# Patient Record
Sex: Female | Born: 1945 | Race: White | Hispanic: No | Smoking: Never smoker
Health system: Southern US, Community
[De-identification: ages and names within clinical notes are randomized; demographics above are authoritative.]

## PROBLEM LIST (undated history)

## (undated) DIAGNOSIS — F419 Anxiety disorder, unspecified: Secondary | ICD-10-CM

## (undated) DIAGNOSIS — Z87440 Personal history of urinary (tract) infections: Secondary | ICD-10-CM

## (undated) HISTORY — DX: Anxiety disorder, unspecified: F41.9

---

## 2009-01-16 ENCOUNTER — Ambulatory Visit (HOSPITAL_COMMUNITY): Admission: RE | Admit: 2009-01-16 | Discharge: 2009-01-16 | Payer: Self-pay | Admitting: Unknown Physician Specialty

## 2010-10-02 ENCOUNTER — Other Ambulatory Visit (HOSPITAL_COMMUNITY): Payer: Self-pay | Admitting: *Deleted

## 2010-10-02 DIAGNOSIS — Z1231 Encounter for screening mammogram for malignant neoplasm of breast: Secondary | ICD-10-CM

## 2010-11-03 ENCOUNTER — Encounter (HOSPITAL_COMMUNITY): Payer: Self-pay

## 2010-11-03 ENCOUNTER — Ambulatory Visit (HOSPITAL_COMMUNITY)
Admission: RE | Admit: 2010-11-03 | Discharge: 2010-11-03 | Disposition: A | Discharge status: Home / Self Care | Payer: 59 | Source: Ambulatory Visit | Attending: Unknown Physician Specialty | Admitting: Unknown Physician Specialty

## 2010-11-03 DIAGNOSIS — Z1231 Encounter for screening mammogram for malignant neoplasm of breast: Secondary | ICD-10-CM

## 2011-01-22 ENCOUNTER — Other Ambulatory Visit (HOSPITAL_COMMUNITY): Payer: Self-pay | Admitting: Unknown Physician Specialty

## 2011-12-17 ENCOUNTER — Other Ambulatory Visit (HOSPITAL_COMMUNITY): Payer: Self-pay | Admitting: Unknown Physician Specialty

## 2011-12-17 DIAGNOSIS — Z1231 Encounter for screening mammogram for malignant neoplasm of breast: Secondary | ICD-10-CM

## 2012-01-11 ENCOUNTER — Ambulatory Visit (HOSPITAL_COMMUNITY)
Admission: RE | Admit: 2012-01-11 | Discharge: 2012-01-11 | Disposition: A | Discharge status: Home / Self Care | Payer: 59 | Source: Ambulatory Visit | Attending: Unknown Physician Specialty | Admitting: Unknown Physician Specialty

## 2012-01-11 DIAGNOSIS — Z1231 Encounter for screening mammogram for malignant neoplasm of breast: Secondary | ICD-10-CM | POA: Insufficient documentation

## 2013-06-06 ENCOUNTER — Encounter: Payer: Self-pay | Admitting: *Deleted

## 2013-06-06 ENCOUNTER — Emergency Department (INDEPENDENT_AMBULATORY_CARE_PROVIDER_SITE_OTHER)
Admission: EM | Admit: 2013-06-06 | Discharge: 2013-06-06 | Disposition: A | Discharge status: Home / Self Care | Payer: 59 | Source: Home / Self Care | Attending: Emergency Medicine | Admitting: Emergency Medicine

## 2013-06-06 DIAGNOSIS — L2089 Other atopic dermatitis: Secondary | ICD-10-CM

## 2013-06-06 DIAGNOSIS — L209 Atopic dermatitis, unspecified: Secondary | ICD-10-CM

## 2013-06-06 MED ORDER — PREDNISONE (PAK) 10 MG PO TABS: 10.0000 mg | ORAL_TABLET | Freq: Every day | ORAL | Status: DC

## 2013-06-06 MED ORDER — METHYLPREDNISOLONE SODIUM SUCC 125 MG IJ SOLR
125.0000 mg | Freq: Once | INTRAMUSCULAR | Status: AC
  Administered 2013-06-06: 125 mg via INTRAMUSCULAR

## 2013-06-06 NOTE — ED Provider Notes (Signed)
CSN: [id number]     Arrival date & time 06/06/13  1844 History   First MD Initiated Contact with Patient 06/06/13 1913     Chief Complaint  Patient presents with  . Rash   (Consider location/radiation/quality/duration/timing/severity/associated sxs/prior Treatment) HPI This patient complains of a RASH.  Location: whole body, she thinks it started on her arms  Onset: 3-4 days   Course: worsening Self-treated with: Benadryl pills and solution             Improvement with treatment: Some symptomatic improvement  History Itching: yes, and it worsened today after she had some stress with taking her mother to position as her mom was just diagnosed with Alzheimer's. Tenderness: no  New medications/antibiotics: no  Pet exposure: no  Recent travel or tropical exposure: no  New soaps, shampoos, detergent, clothing: no  Tick/insect exposure: no   Red Flags Feeling ill: no  Fever: no  Facial/tongue swelling/difficulty breathing:  no  Diabetic or immunocompromised: no     History reviewed. No pertinent past medical history. Past Surgical History  Procedure Laterality Date  . Cesarean section  1979   Family History  Problem Relation Age of Onset  . Alzheimer's disease Mother   . Multiple myeloma Father    History  Substance Use Topics  . Smoking status: Never Smoker   . Smokeless tobacco: Not on file  . Alcohol Use: No   OB History   Grav Para Term Preterm Abortions TAB SAB Ect Mult Living                 Review of Systems  All other systems reviewed and are negative.    Allergies  Review of patient's allergies indicates no known allergies.  Home Medications   Current Outpatient Rx  Name  Route  Sig  Dispense  Refill  . predniSONE (STERAPRED UNI-PAK) 10 MG tablet   Oral   Take 1 tablet (10 mg total) by mouth daily. 6 day pack, use as directed   21 tablet   0    BP 123/68  Pulse 83  Temp(Src) 98.1 F (36.7 C) (Oral)  Resp 18  Wt 151 lb (68.493 kg)  SpO2  96% Physical Exam  Nursing note and vitals reviewed. Constitutional: She is oriented to person, place, and time. She appears well-developed and well-nourished.  HENT:  Head: Normocephalic and atraumatic.  Eyes: No scleral icterus.  Neck: Neck supple.  Cardiovascular: Regular rhythm and normal heart sounds.   Pulmonary/Chest: Effort normal and breath sounds normal. No respiratory distress.  Neurological: She is alert and oriented to person, place, and time.  Skin: Skin is warm and dry.  Widespread scattered erythematous rash mostly on her arms torso and upper legs.  Psychiatric: She has a normal mood and affect. Her speech is normal.    ED Course  Procedures (including critical care time) Labs Review Labs Reviewed - No data to display Imaging Review No results found.  MDM   1. Atopic dermatitis     Unknown reason rash, however it does appear to be an atopic dermatitis, likely worsened by the stress that she had today.  We'll give SoluMedrol 125 intramuscular now and then start a prednisone taper tomorrow morning.  Can continue to use Benadryl or topical creams as needed.  If not improving will need to followup with PCP or dermatology.  Continue to reduce stress.  Cool compresses or cool showers.  Hypoallergenic and nondistended soaps used and avoid alcohol based products.  Marlaine Hind, MD 06/06/13 1919

## 2013-06-06 NOTE — ED Notes (Signed)
Pt c/o itching rash all over her body x 4 days. She has taken benadryl pills and applied benadryl lotion.

## 2013-08-02 ENCOUNTER — Ambulatory Visit: Payer: 59 | Admitting: Physician Assistant

## 2013-08-07 ENCOUNTER — Ambulatory Visit: Payer: 59 | Admitting: Physician Assistant

## 2013-08-09 ENCOUNTER — Ambulatory Visit: Payer: 59 | Admitting: Physician Assistant

## 2013-09-22 ENCOUNTER — Ambulatory Visit: Payer: 59 | Admitting: Physician Assistant

## 2013-10-26 ENCOUNTER — Emergency Department
Admission: EM | Admit: 2013-10-26 | Discharge: 2013-10-26 | Disposition: A | Discharge status: Home / Self Care | Payer: Commercial Managed Care - HMO | Source: Home / Self Care | Attending: Family Medicine | Admitting: Family Medicine

## 2013-10-26 ENCOUNTER — Encounter: Payer: Self-pay | Admitting: Emergency Medicine

## 2013-10-26 DIAGNOSIS — R300 Dysuria: Secondary | ICD-10-CM

## 2013-10-26 LAB — POCT URINALYSIS DIP (MANUAL ENTRY)
Bilirubin, UA: NEGATIVE
Glucose, UA: NEGATIVE
LEUKOCYTES UA: NEGATIVE
NITRITE UA: NEGATIVE
Protein Ur, POC: NEGATIVE
RBC UA: NEGATIVE
Spec Grav, UA: 1.015 (ref 1.005–1.03)
UROBILINOGEN UA: 0.2 (ref 0–1)
pH, UA: 5.5 (ref 5–8)

## 2013-10-26 MED ORDER — CEPHALEXIN 500 MG PO CAPS: 500.0000 mg | ORAL_CAPSULE | Freq: Three times a day (TID) | ORAL | Status: DC

## 2013-10-26 NOTE — ED Notes (Signed)
Danielle Parsons [name] c/o polyuria x 2 days.

## 2013-10-26 NOTE — ED Provider Notes (Signed)
CSN: [id number]     Arrival date & time 10/26/13  1633 History   First MD Initiated Contact with Patient 10/26/13 1642     Chief Complaint  Patient presents with  . Polyuria      HPI  DYSURIA Onset:  2-3 days  Description: dysuria, increased urinary frequency  Modifying factors: none  Symptoms Urgency:  yes Frequency: yes  Hesitancy:  yes Hematuria:  no Flank Pain:  no Fever: no Nausea/Vomiting:  no Missed LMP: no STD exposure: no Discharge: no Irritants: no Rash: no  Red Flags   More than 3 UTI's last 12 months:  No. This will be 3rd.  PMH of  Diabetes or Immunosuppression:  no Renal Disease/Calculi: no Urinary Tract Abnormality:  no Instrumentation or Trauma: no    History reviewed. No pertinent past medical history. Past Surgical History  Procedure Laterality Date  . Cesarean section  1979   Family History  Problem Relation Age of Onset  . Alzheimer's disease Mother   . Multiple myeloma Father    History  Substance Use Topics  . Smoking status: Never Smoker   . Smokeless tobacco: Never Used  . Alcohol Use: No   OB History   Grav Para Term Preterm Abortions TAB SAB Ect Mult Living                 Review of Systems  All other systems reviewed and are negative.      Allergies  Review of patient's allergies indicates no known allergies.  Home Medications  No current outpatient prescriptions on file. BP 103/67  Pulse 89  Temp(Src) 97.8 F (36.6 C) (Oral)  Resp 14  Ht 5' (1.524 m)  Wt 149 lb (67.586 kg)  BMI 29.10 kg/m2  SpO2 97% Physical Exam  Constitutional: She appears well-developed and well-nourished.  HENT:  Head: Normocephalic and atraumatic.  Eyes: Conjunctivae are normal. Pupils are equal, round, and reactive to light.  Neck: Normal range of motion. Neck supple.  Cardiovascular: Normal rate and regular rhythm.   Pulmonary/Chest: Effort normal and breath sounds normal.  Abdominal: Soft. Bowel sounds are normal.  No flank  pain  + suprapubic tenderness  Musculoskeletal: Normal range of motion.  Neurological: She is alert.  Skin: Skin is warm.    ED Course  Procedures (including critical care time) Labs Review Labs Reviewed  URINE CULTURE  POCT URINALYSIS DIP (MANUAL ENTRY)   Imaging Review No results found.    MDM   Final diagnoses:  Dysuria    Will treat with keflex.  Urine culture Discussed infectious and GU red flags at length. Follow up as needed.     The patient and/or caregiver has been counseled thoroughly with regard to treatment plan and/or medications prescribed including dosage, schedule, interactions, rationale for use, and possible side effects and they verbalize understanding. Diagnoses and expected course of recovery discussed and will return if not improved as expected or if the condition worsens. Patient and/or caregiver verbalized understanding.         Shanda Howells, MD 10/26/13 [phone]

## 2013-10-27 LAB — URINE CULTURE
COLONY COUNT: NO GROWTH
Organism ID, Bacteria: NO GROWTH

## 2013-10-29 ENCOUNTER — Telehealth: Payer: Self-pay | Admitting: Emergency Medicine

## 2013-10-29 NOTE — ED Notes (Signed)
Inquired about patient's status; encourage them to call with questions/concerns.  

## 2015-02-15 ENCOUNTER — Ambulatory Visit (INDEPENDENT_AMBULATORY_CARE_PROVIDER_SITE_OTHER): Payer: Commercial Managed Care - HMO

## 2015-02-15 ENCOUNTER — Other Ambulatory Visit: Payer: Self-pay | Admitting: Family Medicine

## 2015-02-15 DIAGNOSIS — R05 Cough: Secondary | ICD-10-CM | POA: Diagnosis not present

## 2015-02-15 DIAGNOSIS — R059 Cough, unspecified: Secondary | ICD-10-CM

## 2015-12-24 DIAGNOSIS — R828 Abnormal findings on cytological and histological examination of urine: Secondary | ICD-10-CM | POA: Diagnosis not present

## 2015-12-24 DIAGNOSIS — Z0000 Encounter for general adult medical examination without abnormal findings: Secondary | ICD-10-CM | POA: Diagnosis not present

## 2015-12-24 DIAGNOSIS — M8580 Other specified disorders of bone density and structure, unspecified site: Secondary | ICD-10-CM | POA: Diagnosis not present

## 2015-12-24 DIAGNOSIS — E785 Hyperlipidemia, unspecified: Secondary | ICD-10-CM | POA: Diagnosis not present

## 2015-12-24 DIAGNOSIS — Z1289 Encounter for screening for malignant neoplasm of other sites: Secondary | ICD-10-CM | POA: Diagnosis not present

## 2015-12-24 DIAGNOSIS — N8111 Cystocele, midline: Secondary | ICD-10-CM | POA: Diagnosis not present

## 2015-12-24 DIAGNOSIS — E559 Vitamin D deficiency, unspecified: Secondary | ICD-10-CM | POA: Diagnosis not present

## 2015-12-24 DIAGNOSIS — Z23 Encounter for immunization: Secondary | ICD-10-CM | POA: Diagnosis not present

## 2015-12-24 DIAGNOSIS — M899 Disorder of bone, unspecified: Secondary | ICD-10-CM | POA: Diagnosis not present

## 2016-01-16 DIAGNOSIS — Z1231 Encounter for screening mammogram for malignant neoplasm of breast: Secondary | ICD-10-CM | POA: Diagnosis not present

## 2016-01-16 DIAGNOSIS — Z780 Asymptomatic menopausal state: Secondary | ICD-10-CM | POA: Diagnosis not present

## 2016-06-01 DIAGNOSIS — Z01411 Encounter for gynecological examination (general) (routine) with abnormal findings: Secondary | ICD-10-CM | POA: Diagnosis not present

## 2016-06-01 DIAGNOSIS — N952 Postmenopausal atrophic vaginitis: Secondary | ICD-10-CM | POA: Diagnosis not present

## 2016-06-01 DIAGNOSIS — N393 Stress incontinence (female) (male): Secondary | ICD-10-CM | POA: Diagnosis not present

## 2016-06-01 DIAGNOSIS — Z780 Asymptomatic menopausal state: Secondary | ICD-10-CM | POA: Diagnosis not present

## 2016-06-01 DIAGNOSIS — N8110 Cystocele, unspecified: Secondary | ICD-10-CM | POA: Diagnosis not present

## 2016-08-17 DIAGNOSIS — N393 Stress incontinence (female) (male): Secondary | ICD-10-CM | POA: Diagnosis not present

## 2016-08-17 DIAGNOSIS — N8111 Cystocele, midline: Secondary | ICD-10-CM | POA: Diagnosis not present

## 2016-08-21 DIAGNOSIS — Z0000 Encounter for general adult medical examination without abnormal findings: Secondary | ICD-10-CM | POA: Diagnosis not present

## 2016-08-21 DIAGNOSIS — F411 Generalized anxiety disorder: Secondary | ICD-10-CM | POA: Diagnosis not present

## 2016-08-21 DIAGNOSIS — M8580 Other specified disorders of bone density and structure, unspecified site: Secondary | ICD-10-CM | POA: Diagnosis not present

## 2016-11-16 DIAGNOSIS — N393 Stress incontinence (female) (male): Secondary | ICD-10-CM | POA: Diagnosis not present

## 2016-11-16 DIAGNOSIS — N816 Rectocele: Secondary | ICD-10-CM | POA: Diagnosis not present

## 2016-11-16 DIAGNOSIS — N8110 Cystocele, unspecified: Secondary | ICD-10-CM | POA: Insufficient documentation

## 2016-11-16 DIAGNOSIS — Z79899 Other long term (current) drug therapy: Secondary | ICD-10-CM | POA: Diagnosis not present

## 2016-11-16 DIAGNOSIS — F419 Anxiety disorder, unspecified: Secondary | ICD-10-CM | POA: Diagnosis not present

## 2016-11-16 DIAGNOSIS — Z90710 Acquired absence of both cervix and uterus: Secondary | ICD-10-CM | POA: Diagnosis not present

## 2016-11-16 DIAGNOSIS — Z7983 Long term (current) use of bisphosphonates: Secondary | ICD-10-CM | POA: Diagnosis not present

## 2016-11-16 DIAGNOSIS — N8111 Cystocele, midline: Secondary | ICD-10-CM | POA: Diagnosis not present

## 2016-11-17 DIAGNOSIS — Z90710 Acquired absence of both cervix and uterus: Secondary | ICD-10-CM | POA: Diagnosis not present

## 2016-11-17 DIAGNOSIS — N816 Rectocele: Secondary | ICD-10-CM | POA: Diagnosis not present

## 2016-11-17 DIAGNOSIS — Z7983 Long term (current) use of bisphosphonates: Secondary | ICD-10-CM | POA: Diagnosis not present

## 2016-11-17 DIAGNOSIS — Z79899 Other long term (current) drug therapy: Secondary | ICD-10-CM | POA: Diagnosis not present

## 2016-11-17 DIAGNOSIS — N393 Stress incontinence (female) (male): Secondary | ICD-10-CM | POA: Diagnosis not present

## 2016-11-17 DIAGNOSIS — N8111 Cystocele, midline: Secondary | ICD-10-CM | POA: Diagnosis not present

## 2017-02-01 DIAGNOSIS — R208 Other disturbances of skin sensation: Secondary | ICD-10-CM | POA: Diagnosis not present

## 2017-02-01 DIAGNOSIS — S61208A Unspecified open wound of other finger without damage to nail, initial encounter: Secondary | ICD-10-CM | POA: Diagnosis not present

## 2017-02-18 DIAGNOSIS — F411 Generalized anxiety disorder: Secondary | ICD-10-CM | POA: Insufficient documentation

## 2017-02-18 DIAGNOSIS — M8589 Other specified disorders of bone density and structure, multiple sites: Secondary | ICD-10-CM | POA: Insufficient documentation

## 2017-02-19 DIAGNOSIS — F411 Generalized anxiety disorder: Secondary | ICD-10-CM | POA: Diagnosis not present

## 2017-02-19 DIAGNOSIS — M8589 Other specified disorders of bone density and structure, multiple sites: Secondary | ICD-10-CM | POA: Diagnosis not present

## 2017-02-19 DIAGNOSIS — Z1231 Encounter for screening mammogram for malignant neoplasm of breast: Secondary | ICD-10-CM | POA: Diagnosis not present

## 2017-03-08 DIAGNOSIS — N3946 Mixed incontinence: Secondary | ICD-10-CM | POA: Diagnosis not present

## 2017-03-23 DIAGNOSIS — R3915 Urgency of urination: Secondary | ICD-10-CM | POA: Diagnosis not present

## 2017-03-24 DIAGNOSIS — R3915 Urgency of urination: Secondary | ICD-10-CM | POA: Diagnosis not present

## 2017-04-14 DIAGNOSIS — Z0000 Encounter for general adult medical examination without abnormal findings: Secondary | ICD-10-CM | POA: Diagnosis not present

## 2017-06-07 DIAGNOSIS — Z01419 Encounter for gynecological examination (general) (routine) without abnormal findings: Secondary | ICD-10-CM | POA: Diagnosis not present

## 2017-06-07 DIAGNOSIS — R351 Nocturia: Secondary | ICD-10-CM | POA: Diagnosis not present

## 2017-06-07 DIAGNOSIS — N951 Menopausal and female climacteric states: Secondary | ICD-10-CM | POA: Diagnosis not present

## 2017-06-07 DIAGNOSIS — Z0000 Encounter for general adult medical examination without abnormal findings: Secondary | ICD-10-CM | POA: Diagnosis not present

## 2017-06-07 DIAGNOSIS — Z136 Encounter for screening for cardiovascular disorders: Secondary | ICD-10-CM | POA: Diagnosis not present

## 2017-06-07 DIAGNOSIS — N952 Postmenopausal atrophic vaginitis: Secondary | ICD-10-CM | POA: Diagnosis not present

## 2017-06-07 DIAGNOSIS — Z1329 Encounter for screening for other suspected endocrine disorder: Secondary | ICD-10-CM | POA: Diagnosis not present

## 2017-09-03 DIAGNOSIS — M8589 Other specified disorders of bone density and structure, multiple sites: Secondary | ICD-10-CM | POA: Diagnosis not present

## 2017-09-03 DIAGNOSIS — F411 Generalized anxiety disorder: Secondary | ICD-10-CM | POA: Diagnosis not present

## 2017-09-03 DIAGNOSIS — Z79899 Other long term (current) drug therapy: Secondary | ICD-10-CM | POA: Diagnosis not present

## 2017-09-03 DIAGNOSIS — Z0000 Encounter for general adult medical examination without abnormal findings: Secondary | ICD-10-CM | POA: Diagnosis not present

## 2017-09-03 DIAGNOSIS — R350 Frequency of micturition: Secondary | ICD-10-CM | POA: Diagnosis not present

## 2017-09-03 DIAGNOSIS — R32 Unspecified urinary incontinence: Secondary | ICD-10-CM | POA: Diagnosis not present

## 2017-09-13 DIAGNOSIS — Z79899 Other long term (current) drug therapy: Secondary | ICD-10-CM | POA: Diagnosis not present

## 2017-12-30 ENCOUNTER — Emergency Department (INDEPENDENT_AMBULATORY_CARE_PROVIDER_SITE_OTHER): Payer: Medicare Other

## 2017-12-30 ENCOUNTER — Emergency Department (INDEPENDENT_AMBULATORY_CARE_PROVIDER_SITE_OTHER)
Admission: EM | Admit: 2017-12-30 | Discharge: 2017-12-30 | Disposition: A | Discharge status: Home / Self Care | Payer: Medicare Other | Source: Home / Self Care | Attending: Emergency Medicine | Admitting: Emergency Medicine

## 2017-12-30 ENCOUNTER — Other Ambulatory Visit: Payer: Self-pay

## 2017-12-30 DIAGNOSIS — M25561 Pain in right knee: Secondary | ICD-10-CM

## 2017-12-30 MED ORDER — PREDNISONE 20 MG PO TABS: ORAL_TABLET | ORAL | 0 refills | Status: DC

## 2017-12-30 NOTE — Discharge Instructions (Addendum)
Take prednisone 1 a day for 5 days. Do not take ibuprofen while you are taking the prednisone. When you finish your prednisone you may take ibuprofen 3 tablets twice a day of the 200 mg dosage. These apply ice as instructed. Please avoid steps.

## 2017-12-30 NOTE — ED Provider Notes (Signed)
[facility]    CSN: [id number] Arrival date & time: 12/30/17  1030     History   Chief Complaint Chief Complaint  Patient presents with  . Leg Pain    HPI Danielle Parsons is a 72 y.o. female.  Patient states for the last 2 months patient has had significant pain in the back of her right knee.  She has a throbbing sensation with discomfort.  She has seen her primary care physician 2 times.  Ultrasound done for DVT was negative.  She primarily has difficulty when getting up from a seated position to a standing position.  Also when she gets up first thing in the morning she has significant discomfort in the back of her knee.  She has no hip or ankle discomfort.  She has been physically very active paining her home. HPI  History reviewed. No pertinent past medical history.  There are no active problems to display for this patient.   Past Surgical History:  Procedure Laterality Date  . CESAREAN SECTION  1979    OB History   None      Home Medications    Prior to Admission medications   Medication Sig Start Date End Date Taking? Authorizing Provider  venlafaxine (EFFEXOR) 75 MG tablet Take 75 mg by mouth 2 (two) times daily.   Yes [provider]  cephALEXin (KEFLEX) 500 MG capsule Take 1 capsule (500 mg total) by mouth 3 (three) times daily. 10/26/13   Deneise Lever, MD  predniSONE (DELTASONE) 20 MG tablet Take 1 tablet daily for 5 days 12/30/17   Darlyne Russian, MD    Family History Family History  Problem Relation Age of Onset  . Multiple myeloma Father   . Alzheimer's disease Mother     Social History Social History   Tobacco Use  . Smoking status: Never Smoker  . Smokeless tobacco: Never Used  Substance Use Topics  . Alcohol use: No  . Drug use: No     Allergies   Patient has no known allergies.   Review of Systems Review of Systems  Constitutional: Negative.   Cardiovascular: Negative.   Gastrointestinal: Negative.     Musculoskeletal: Positive for gait problem.       Significant pain in the back of her right knee.     Physical Exam Triage Vital Signs ED Triage Vitals  Enc Vitals Group     BP 12/30/17 1109 122/81     Pulse Rate 12/30/17 1109 76     Resp --      Temp 12/30/17 1109 97.7 F (36.5 C)     Temp Source 12/30/17 1109 Oral     SpO2 12/30/17 1109 97 %     Weight 12/30/17 1110 150 lb (68 kg)     Height 12/30/17 1110 5' 1"  (1.549 m)     Head Circumference --      Peak Flow --      Pain Score 12/30/17 1110 4     Pain Loc --      Pain Edu? --      Excl. in [place]? --    No data found.  Updated Vital Signs BP 122/81 (BP Location: Right Arm)   Pulse 76   Temp 97.7 F (36.5 C) (Oral)   Ht 5' 1"  (1.549 m)   Wt 150 lb (68 kg)   SpO2 97%   BMI 28.34 kg/m   Visual Acuity Right Eye Distance:   Left Eye Distance:  Bilateral Distance:    Right Eye Near:   Left Eye Near:    Bilateral Near:     Physical Exam  Constitutional: She is oriented to person, place, and time.  Musculoskeletal:  There is tenderness in the popliteal area.  There is no swelling noted.  No cyst could be felt.  There is no joint instability.  Negative McMurray negative Lockman negative drawer sign.  There is no rash noted.  Examination of the right ankle and right hip are normal.  Dorsalis pedis posterior tibial pulses 2+.  Motor strength 5 out of 5 in the right ankle.  Neurological: She is alert and oriented to person, place, and time.  Skin: Skin is warm and dry.     UC Treatments / Results  Labs (all labs ordered are listed, but only abnormal results are displayed) Labs Reviewed - No data to display  EKG None Radiology Dg Knee Ap/lat W/sunrise Right  Result Date: 12/30/2017 CLINICAL DATA:  Right knee pain, no known injury, initial encounter EXAM: RIGHT KNEE 3 VIEWS COMPARISON:  None. FINDINGS: No acute fracture or dislocation is noted. Mild medial joint space narrowing is seen. No joint effusion or  other soft tissue abnormality is noted. IMPRESSION: Mild degenerative change without acute abnormality. Electronically Signed   By: Inez Catalina M.D.   On: 12/30/2017 11:40    Procedures Procedures (including critical care time)  Medications Ordered in UC Medications - No data to display   Initial Impression / Assessment and Plan / UC Course  I have reviewed the triage vital signs and the nursing notes.  Pertinent labs & imaging results that were available during my care of the patient were reviewed by me and considered in my medical decision making (see chart for details).     Patient has pain with weightbearing.  X-ray is unremarkable.  I reviewed her ultrasound which was also read as unremarkable and did not mention a popliteal cyst which was not palpable on exam.  She has been taking ibuprofen 600 mg once a day and I advised her we would try prednisone 20 mg 1 a day for 5 days.  She will hold her ibuprofen for those 5 days and then start back on ibuprofen maximum 600 mg a day.  Appointment will be made sports medicine orthopedics at [facility] for follow-up.  Final Clinical Impressions(s) / UC Diagnoses   Final diagnoses:  Posterior right knee pain    ED Discharge Orders        Ordered    predniSONE (DELTASONE) 20 MG tablet     12/30/17 1228     Take prednisone 1 a day for 5 days. Do not take ibuprofen while you are taking the prednisone. When you finish your prednisone you may take ibuprofen 3 tablets twice a day of the 200 mg dosage. These apply ice as instructed. Please avoid steps.  Controlled Substance Prescriptions  Controlled Substance Registry consulted? Not Applicable   Darlyne Russian, MD 12/30/17 [id number]

## 2017-12-30 NOTE — ED Triage Notes (Signed)
Started about 2 months ago with pain in the right leg.  It is behind the knee and radiates up into the thigh and into the calf.

## 2018-01-05 ENCOUNTER — Ambulatory Visit (INDEPENDENT_AMBULATORY_CARE_PROVIDER_SITE_OTHER): Payer: Medicare Other | Admitting: Family Medicine

## 2018-01-05 ENCOUNTER — Encounter: Payer: Self-pay | Admitting: Family Medicine

## 2018-01-05 VITALS — BP 102/64 | HR 86 | Ht 60.98 in | Wt 151.0 lb

## 2018-01-05 DIAGNOSIS — M1711 Unilateral primary osteoarthritis, right knee: Secondary | ICD-10-CM | POA: Diagnosis not present

## 2018-01-05 DIAGNOSIS — N393 Stress incontinence (female) (male): Secondary | ICD-10-CM | POA: Insufficient documentation

## 2018-01-05 DIAGNOSIS — N952 Postmenopausal atrophic vaginitis: Secondary | ICD-10-CM | POA: Insufficient documentation

## 2018-01-05 DIAGNOSIS — S76311A Strain of muscle, fascia and tendon of the posterior muscle group at thigh level, right thigh, initial encounter: Secondary | ICD-10-CM | POA: Diagnosis not present

## 2018-01-05 MED ORDER — DICLOFENAC SODIUM 1 % TD GEL: 4.0000 g | Freq: Four times a day (QID) | TRANSDERMAL | 11 refills | Status: DC

## 2018-01-05 NOTE — Progress Notes (Signed)
Subjective:    I'm seeing this patient as a consultation for:  Dr Cleta Albertsaub  CC: Right Knee Pain  HPI: Right posterior knee pain worsening slowly since October of 2018 worse over the last 2 months after a long cross country trip. She notes that the pain is located at the posterior aspect of the knee worse with standing and walking.  She is tried some over-the-counter ibuprofen which is helped a bit.  She denies any radiating pain weakness or numbness.  She cannot recall any injury.  No fevers or chills nausea vomiting or diarrhea.  She was seen in urgent care on April 18 where x-rays showed mild DJD.  Given a trial of a short prednisone burst which did not help much.  Patient notes that she is been to her primary care provider for this and had an evaluation that ruled out blood clot.  Past medical history, Surgical history, Family history not pertinant except as noted below, Social history, Allergies, and medications have been entered into the medical record, reviewed, and no changes needed.   Review of Systems: No headache, visual changes, nausea, vomiting, diarrhea, constipation, dizziness, abdominal pain, skin rash, fevers, chills, night sweats, weight loss, swollen lymph nodes, body aches, joint swelling, muscle aches, chest pain, shortness of breath, mood changes, visual or auditory hallucinations.   Objective:    Vitals:   01/05/18 1347  BP: 102/64  Pulse: 86  SpO2: 96%   General: Well Developed, well nourished, and in no acute distress.  Neuro/Psych: Alert and oriented x3, extra-ocular muscles intact, able to move all 4 extremities, sensation grossly intact. Skin: Warm and dry, no rashes noted.  Respiratory: Not using accessory muscles, speaking in full sentences, trachea midline.  Cardiovascular: Pulses palpable, no extremity edema. Abdomen: Does not appear distended. MSK:  Right knee largely normal-appearing with no effusion erythema ecchymosis. Normal range of motion 0-120  degrees without significant crepitations. Mildly tender to palpation along the course of the lateral hamstring tendons insertion onto the lateral knee. Intact extension strength without pain. Intact flexion strength but pain is reproduced in the lateral hamstring tendons. Stable ligamentous exam. Negative McMurray's testing. Normal gait.   EXAM: RIGHT KNEE 3 VIEWS  COMPARISON:  None.  FINDINGS: No acute fracture or dislocation is noted. Mild medial joint space narrowing is seen. No joint effusion or other soft tissue abnormality is noted.  IMPRESSION: Mild degenerative change without acute abnormality.   Electronically Signed   By: Alcide CleverMark  Lukens M.D.   On: 12/30/2017 11:40 I personally (independently) visualized and performed the interpretation of the images attached in this note.   Impression and Recommendations:    Assessment and Plan: 72 y.o. female with right knee pain very likely due to hamstring tendinitis versus strain.  We discussed treatment options.  Plan for physical therapy and diclofenac gel.  Recheck in 6 weeks.  If not better next step would be MRI likely.  Of note patient is delightful.   Orders Placed This Encounter  Procedures  . Ambulatory referral to Physical Therapy    Referral Priority:   Routine    Referral Type:   Physical Medicine    Referral Reason:   Specialty Services Required    Requested Specialty:   Physical Therapy   Meds ordered this encounter  Medications  . diclofenac sodium (VOLTAREN) 1 % GEL    Sig: Apply 4 g topically 4 (four) times daily. To affected joint.    Dispense:  100 g    Refill:  11    Patient has already tried ibuprofen and naproxen.    Discussed warning signs or symptoms. Please see discharge instructions. Patient expresses understanding.   CC: Loleta Dicker, FNP

## 2018-01-05 NOTE — Patient Instructions (Signed)
Thank you for coming in today. Attend PT.  Use the diclofenac gel up to 4x daily Consider a knee sleeve.  Recheck with me in 6 weeks.  Return sooner if needed.    Hamstring Strain A hamstring strain is an injury that occurs when the hamstring muscles are overstretched or overloaded. The hamstring muscles are a group of muscles at the back of the thighs. These muscles are used in straightening the hips, bending the knees, and pulling back the legs. This type of injury is often called a pulled hamstring muscle. The severity of a muscle strain is rated in degrees. First-degree strains have the least amount of muscle fiber tearing and pain. Second-degree and third-degree strains have increasingly more tearing and pain. What are the causes? Hamstring strains occur when a sudden, violent force is placed on these muscles and stretches them too far. This often occurs during activities that involve running, jumping, kicking, or weight lifting. What increases the risk? Hamstring strains are especially common in athletes. Other things that can increase your risk for this injury include:  Having low strength, endurance, or flexibility of the hamstring muscles.  Performing high-impact physical activity.  Having poor physical fitness.  Having a previous leg injury.  Having fatigued muscles.  Older age.  What are the signs or symptoms?  Pain in the back of the thigh.  Bruising.  Swelling.  Muscle spasm.  Difficulty using the muscle because of pain or lack of normal function. For severe strains, you may have a popping or snapping feeling when the injury occurs. How is this diagnosed? Your health care provider will perform a physical exam and ask about your medical history. How is this treated? Often, the best treatment for a hamstring strain is protecting, resting, icing, applying compression, and elevating the injured area. This is referred to as the PRICE method of treatment. Your health  care provider may also recommend medicines to help reduce pain or inflammation. Follow these instructions at home:  Use the PRICE method of treatment to promote muscle healing during the first 2-3 days after your injury. The PRICE method involves: ? P-Protecting the muscle from being injured again. ? R-Restricting your activity and resting the injured body part. ? I-Icing your injury. To do this, put ice in a plastic bag. Place a towel between your skin and the bag. Then, apply the ice and leave it on for 20 minutes, 2-3 times per day. After the third day, switch to moist heat packs. ? C-Applying compression to the injured area with an elastic bandage. Be careful not to wrap it too tightly. That may interfere with blood circulation or may increase swelling. ? E-Elevating the injured body part above the level of your heart as often as you can. You can do this by putting a pillow under your thigh when you sit or lie down.  Take medicines only as directed by your health care provider.  Begin exercising or stretching as directed by your health care provider.  Do not return to full activity level until your health care provider approves.  Keep all follow-up visits as directed by your health care provider. This is important. Contact a health care provider if:  You have increasing pain or swelling in the injured area.  You have numbness, tingling, or a significant loss of strength in the injured area.  Your foot or your toes become cold or turn blue. This information is not intended to replace advice given to you by your health care  provider. Make sure you discuss any questions you have with your health care provider. Document Released: 05/26/2001 Document Revised: 02/06/2016 Document Reviewed: 04/16/2014 Elsevier Interactive Patient Education  2018 [employer].

## 2018-01-11 ENCOUNTER — Ambulatory Visit (INDEPENDENT_AMBULATORY_CARE_PROVIDER_SITE_OTHER): Payer: Medicare Other | Admitting: Physical Therapy

## 2018-01-11 ENCOUNTER — Encounter: Payer: Self-pay | Admitting: Physical Therapy

## 2018-01-11 DIAGNOSIS — M6281 Muscle weakness (generalized): Secondary | ICD-10-CM | POA: Diagnosis not present

## 2018-01-11 DIAGNOSIS — M79661 Pain in right lower leg: Secondary | ICD-10-CM

## 2018-01-11 DIAGNOSIS — M62838 Other muscle spasm: Secondary | ICD-10-CM

## 2018-01-11 NOTE — Therapy (Signed)
[facility] [place] [address] [place] [place], [place], [address] Phone: [phone]   Fax:  [phone]  Physical Therapy Evaluation  Patient Details  Name: Danielle Parsons MRN: [id number] Date of Birth: November 15, 1945 Referring Provider: Dr Clementeen Graham   Encounter Date: 01/11/2018  PT End of Session - 01/11/18 1107    Visit Number  1    Number of Visits  4    Date for PT Re-Evaluation  01/25/18    PT Start Time  1107    PT Stop Time  1155    PT Time Calculation (min)  48 min       History reviewed. No pertinent past medical history.  Past Surgical History:  Procedure Laterality Date  . CESAREAN SECTION  1979    There were no vitals filed for this visit.   Subjective Assessment - 01/11/18 1107    Subjective  Pt reports medial and lateral Rt knee and sometimes posterior lateral .  She loves to walk and this is when  she is having her pain. This began in Feb after driving [place] country.     How long can you sit comfortably?  no problem/limitation    How long can you walk comfortably?  limited at about 2-3 min.     Diagnostic tests  diagnostic US (-) for clots and tears, x-rays (-)     Patient Stated Goals  walk without pain    Pain Score  -- 7/10 first thing in AM    Pain Location  Leg lateral distal Rt hamstring    Pain Orientation  Right;Lower;Lateral    Pain Descriptors / Indicators  Throbbing         [facility] PT Assessment - 01/11/18 0001      Assessment   Medical Diagnosis  Rt hamstring strain and knee OA    Referring Provider  Dr Clementeen Graham    Onset Date/Surgical Date  11/11/17    Hand Dominance  Left    Next MD Visit  after therapy    Prior Therapy  none      Precautions   Precautions  None      Balance Screen   Has the patient fallen in the past 6 months  No      Home Environment   Living Environment  Private residence    Home Layout  One level      Prior Function   Level of Independence  Independent    Vocation  Retired    Leisure  travel, socialize a lot.       Observation/Other Assessments   Focus on Therapeutic Outcomes (FOTO)   50% limited      Functional Tests   Functional tests  Squat;Single leg stance      Squat   Comments  bracing knees together      Single Leg Stance   Comments  Lt > 12 sec, Rt 8 sec with a lot a accessory motion      Posture/Postural Control   Posture/Postural Control  Postural limitations    Postural Limitations  -- initial weight shift on to left LE vlagus      ROM / Strength   AROM / PROM / Strength  AROM;Strength      AROM   Overall AROM Comments  bilat hips/knee/ankles WNL      Strength   Strength Assessment Site  Hip;Knee;Ankle    Right/Left Hip  -- Lt WNL, Rt WNL except extension 4+/5  Right/Left Knee  -- Rt 5-/5, however pain with Rt mid and lateral HS resistance      Flexibility   Soft Tissue Assessment /Muscle Length  yes    Hamstrings  supine SLR Lt 82, Rt 92      Palpation   Palpation comment  trigger points with tenderness in lateral Rt gastroc and distal medial and lateral hamstring above knees.                 Objective measurements completed on examination: See above findings.      [facility] Adult PT Treatment/Exercise - 01/11/18 0001      Exercises   Exercises  Knee/Hip      Knee/Hip Exercises: Stretches   Active Hamstring Stretch  Right supine with strap and knee presses    Gastroc Stretch  Right;30 seconds VC to keep foot straight      Knee/Hip Exercises: Supine   Bridges  Strengthening;10 reps figure 4 with eccentric focus    Single Leg Bridge  -- attempted to hard developed cramp      Knee/Hip Exercises: Prone   Hamstring Curl  10 reps Rt with eccentric focus             PT Education - 01/11/18 1146    Education provided  Yes    Education Details  HEP    Person(s) Educated  Patient    Methods  Explanation;Demonstration;Handout    Comprehension  Returned demonstration;Verbalized  understanding          PT Long Term Goals - 01/11/18 1211      PT LONG TERM GOAL #1   Title  I with HEP ( 01/25/18)     Time  3    Period  Weeks    Status  New      PT LONG TERM GOAL #2   Title  report =/> 75% improvement in rt lower leg pain with standing ( 01/25/18)     Time  2    Period  Weeks    Status  New      PT LONG TERM GOAL #3   Title  Rt hamstring strength = to lLt without pain ( 01/25/18)     Time  2    Period  Weeks    Status  New      PT LONG TERM GOAL #4   Title  improve FOTO =/< 37% limited ( 01/25/18)     Time  2    Period  Weeks    Status  New             Plan - 01/11/18 1203    Clinical Impression Statement  72 yo female with ~ 2 month h/o Rt lateral hamstring pain.  She has some weakness in the Rt LE, point tenderness in the semimembranosus and biceps femoris. Also tender and large trigger point in lateral Rt gastroc. She has pain with standing after being still then it eases up and she can walk.  Patient will only be in town this week and next so treatment will be limited. We can give her a good HEP to perform while she is gone.     Clinical Presentation  Stable    Clinical Decision Making  Low    Rehab Potential  Excellent    PT Frequency  2x / week    PT Duration  2 weeks    PT Treatment/Interventions  Dry needling;Manual techniques;Moist Heat;Taping;Patient/family education;Therapeutic activities;Ultrasound;Cryotherapy;Electrical Stimulation    PT  Next Visit Plan  manual work to Rt hamstring and gastroc. self trigger point release with ball, modalities PRN and eccentric work.     Consulted and Agree with Plan of Care  Patient       Patient will benefit from skilled therapeutic intervention in order to improve the following deficits and impairments:  Pain, Decreased strength, Increased muscle spasms  Visit Diagnosis: Pain in right lower leg - Plan: PT plan of care cert/re-cert  Muscle weakness (generalized) - Plan: PT plan of care  cert/re-cert  Other muscle spasm - Plan: PT plan of care cert/re-cert     Problem List Patient Active Problem List   Diagnosis Date Noted  . 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 Atrophic vaginitis 01/05/2018  . Female stress incontinence 01/05/2018  . GAD (generalized anxiety disorder) 02/18/2017  . Osteopenia of multiple sites 02/18/2017  . Cystocele with rectocele 11/16/2016    Roderic Scarce PT  01/11/2018, 12:16 PM  [facility] [address] [place] [address] [place], [place], [address] Phone: [phone]   Fax:  [phone]  Name: Braelee Herrle MRN: [id number] Date of Birth: April 03, 1946

## 2018-01-18 ENCOUNTER — Ambulatory Visit (INDEPENDENT_AMBULATORY_CARE_PROVIDER_SITE_OTHER): Payer: Medicare Other | Admitting: Physical Therapy

## 2018-01-18 DIAGNOSIS — M79661 Pain in right lower leg: Secondary | ICD-10-CM

## 2018-01-18 DIAGNOSIS — M62838 Other muscle spasm: Secondary | ICD-10-CM | POA: Diagnosis not present

## 2018-01-18 DIAGNOSIS — M6281 Muscle weakness (generalized): Secondary | ICD-10-CM | POA: Diagnosis not present

## 2018-01-18 NOTE — Patient Instructions (Addendum)
Adductor Stretch: Reclined (Strap, Wall)    Warm up with leg vertical. Rotate leg to side and fix foot to wall. Anchor opposite hip. Hold for ____ breaths. Repeat ____ times each leg.  Copyright  VHI. All rights reserved.  Outer Hip Stretch: Reclined IT Band Stretch (Strap)    Strap around opposite foot, pull across only as far as possible with shoulders on mat. Hold for ____ breaths. Repeat ____ times each leg.  Copyright  VHI. All rights reserved.

## 2018-01-18 NOTE — Therapy (Signed)
[facility] [place] [address] [place] [address] [place], [place], [address] Phone: [phone]   Fax:  [phone]  Physical Therapy Treatment  Patient Details  Name: Danielle Parsons MRN: [id number] Date of Birth: 05/29/1946 Referring Provider: Dr. Clementeen Graham    Encounter Date: 01/18/2018  PT End of Session - 01/18/18 1117    Visit Number  2    Number of Visits  4    Date for PT Re-Evaluation  01/25/18    PT Start Time  1020    PT Stop Time  1115    PT Time Calculation (min)  55 min       No past medical history on file.  Past Surgical History:  Procedure Laterality Date  . CESAREAN SECTION  1979    There were no vitals filed for this visit.  Subjective Assessment - 01/18/18 1023    Subjective  Pt reports her pain in her RLE increased after her evaluation.  She states she had a 10/10 pain; reduced with daughter massage, ice, medicine.  "I feel worse than before".  "I think I'm favoring it".     Patient Stated Goals  walk without pain    Pain Score  5     Pain Location  Leg    Pain Orientation  Right;Lateral    Pain Descriptors / Indicators  Aching         [facility] PT Assessment - 01/18/18 0001      Assessment   Medical Diagnosis  Rt hamstring strain and knee OA    Referring Provider  Dr. Clementeen Graham     Onset Date/Surgical Date  11/11/17    Hand Dominance  Left    Next MD Visit  after therapy       [facility] Adult PT Treatment/Exercise - 01/18/18 0001      Self-Care   Self-Care  Other Self-Care Comments    Other Self-Care Comments   Pt educated on self massage with roller stick to Rt calf and hamstring.  Pt verbalized understanding and returned demo.        Knee/Hip Exercises: Stretches   Passive Hamstring Stretch  Right;3 reps;30 seconds;Limitations    Passive Hamstring Stretch Limitations  seated hamstring stretch x 1 rep each side.     ITB Stretch  Right;2 reps;30 seconds    Gastroc Stretch  Right;Left;30 seconds;3 reps 2 reps  wiht heel off of step    Other Knee/Hip Stretches  adductor stretch in supine with strap for RLE x 30 sec x 2 reps      Knee/Hip Exercises: Aerobic   Nustep  L4: 6 min  PTA present to discuss progress.       Knee/Hip Exercises: Supine   Bridges  Both;1 set;10 reps;Limitations    Bridges Limitations  trial of 2 reps of fig 4 bridge on RLE, increased pain- switched to bilat.       Knee/Hip Exercises: Prone   Hamstring Curl  10 reps Rt with eccentric focus      Modalities   Modalities  Electrical Stimulation;Moist Heat      Moist Heat Therapy   Number Minutes Moist Heat  15 Minutes    Moist Heat Location  -- Rt hamstring/calf      Electrical Stimulation   Electrical Stimulation Location  prox Rt calf, distral Rt hamstring    Electrical Stimulation Action  IFC    Electrical Stimulation Parameters  to tolerance    Electrical Stimulation Goals  Pain  PT Long Term Goals - 01/18/18 1319      PT LONG TERM GOAL #1   Title  I with HEP ( 01/25/18)     Time  3    Period  Weeks    Status  On-going      PT LONG TERM GOAL #2   Title  report =/> 75% improvement in rt lower leg pain with standing ( 01/25/18)     Time  2    Period  Weeks    Status  On-going      PT LONG TERM GOAL #3   Title  Rt hamstring strength = to lLt without pain ( 01/25/18)     Time  2    Period  Weeks    Status  On-going      PT LONG TERM GOAL #4   Title  improve FOTO =/< 37% limited ( 01/25/18)     Time  2    Period  Weeks    Status  On-going            Plan - 01/18/18 1040    Clinical Impression Statement  Changed fig 4 bridge to regular bridge due to patient reporting increased LE pain. She tolerated all exercises well, without increase in symptoms.  Pt reported elimination of RLE pain after use of estim/MHP.     Rehab Potential  Excellent    PT Frequency  2x / week    PT Duration  2 weeks    PT Treatment/Interventions  Dry needling;Manual techniques;Moist Heat;Taping;Patient/family  education;Therapeutic activities;Ultrasound;Cryotherapy;Electrical Stimulation;Therapeutic exercise updated to inclue ther ex, per supervising PT, Roderic Scarce.    PT Next Visit Plan  assess pelvis, manual work to calf and hamstring, issue info on TENS.     Consulted and Agree with Plan of Care  Patient       Patient will benefit from skilled therapeutic intervention in order to improve the following deficits and impairments:  Pain, Decreased strength, Increased muscle spasms  Visit Diagnosis: Pain in right lower leg  Muscle weakness (generalized)  Other muscle spasm     Problem List Patient Active Problem List   Diagnosis Date Noted  . 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 Atrophic vaginitis 01/05/2018  . Female stress incontinence 01/05/2018  . GAD (generalized anxiety disorder) 02/18/2017  . Osteopenia of multiple sites 02/18/2017  . Cystocele with rectocele 11/16/2016   Danielle Parsons, PTA 01/18/18 1:20 PM  [facility] [address] [place] [address] [place], [place], [address] Phone: [phone]   Fax:  [phone]  Name: Danielle Parsons MRN: [id number] Date of Birth: 1946/01/16

## 2018-01-20 ENCOUNTER — Ambulatory Visit (INDEPENDENT_AMBULATORY_CARE_PROVIDER_SITE_OTHER): Payer: Medicare Other | Admitting: Physical Therapy

## 2018-01-20 DIAGNOSIS — M62838 Other muscle spasm: Secondary | ICD-10-CM

## 2018-01-20 DIAGNOSIS — M6281 Muscle weakness (generalized): Secondary | ICD-10-CM

## 2018-01-20 DIAGNOSIS — M79661 Pain in right lower leg: Secondary | ICD-10-CM | POA: Diagnosis present

## 2018-01-20 NOTE — Patient Instructions (Addendum)
Adductor Stretch: Reclined (Strap, Wall)    Warm up with leg vertical. Rotate leg to side and fix foot to wall. Anchor opposite hip. Hold for __20 seconds__  Repeat __2__ times each leg.  Outer Hip Stretch: Reclined IT Band Stretch (Strap)    Strap around opposite foot, pull across only as far as possible with shoulders on mat. Hold for _20 seconds. Repeat __2_ times each leg.  TENS UNIT: This is helpful for muscle pain and spasm.   Search and Purchase a TENS 7000 2nd edition at [employer] It should be less than $30.     TENS unit instructions: Do not shower or bathe with the unit on Turn the unit off before removing electrodes or batteries If the electrodes lose stickiness add a drop of water to the electrodes after they are disconnected from the unit and place on plastic sheet. If you continued to have difficulty, call the TENS unit company to purchase more electrodes. Do not apply lotion on the skin area prior to use. Make sure the skin is clean and dry as this will help prolong the life of the electrodes. After use, always check skin for unusual red areas, rash or other skin difficulties. If there are any skin problems, does not apply electrodes to the same area. Never remove the electrodes from the unit by pulling the wires. Do not use the TENS unit or electrodes other than as directed. Do not change electrode placement without consultating your therapist or physician. Keep 2 fingers with between each electrode. Wear time ratio is 2:1, on to off times.    For example on for 30 minutes off for 15 minutes and then on for 30 minutes off for 15 minutes  Trigger Point Dry Needling  . What is Trigger Point Dry Needling (DN)? o DN is a physical therapy technique used to treat muscle pain and dysfunction. Specifically, DN helps deactivate muscle trigger points (muscle knots).  o A thin filiform needle is used to penetrate the skin and stimulate the underlying trigger point.  The goal is for a local twitch response (LTR) to occur and for the trigger point to relax. No medication of any kind is injected during the procedure.   . What Does Trigger Point Dry Needling Feel Like?  o The procedure feels different for each individual patient. Some patients report that they do not actually feel the needle enter the skin and overall the process is not painful. Very mild bleeding may occur. However, many patients feel a deep cramping in the muscle in which the needle was inserted. This is the local twitch response.   Marland Kitchen How Will I feel after the treatment? o Soreness is normal, and the onset of soreness may not occur for a few hours. Typically this soreness does not last longer than two days.  o Bruising is uncommon, however; ice can be used to decrease any possible bruising.  o In rare cases feeling tired or nauseous after the treatment is normal. In addition, your symptoms may get worse before they get better, this period will typically not last longer than 24 hours.   . What Can I do After My Treatment? o Increase your hydration by drinking more water for the next 24 hours. o You may place ice or heat on the areas treated that have become sore, however, do not use heat on inflamed or bruised areas. Heat often brings more relief post needling. o You can continue your regular activities, but vigorous activity is  not recommended initially after the treatment for 24 hours. o DN is best combined with other physical therapy such as strengthening, stretching, and other therapies.

## 2018-01-20 NOTE — Therapy (Signed)
[facility] [place] [address] [place] [address] [place], [place], [address] Phone: [phone]   Fax:  [phone]  Physical Therapy Treatment  Patient Details  Name: Danielle Parsons MRN: [id number] Date of Birth: [birth year] Referring Provider: Dr. Clementeen Graham   Encounter Date: 01/20/2018  PT End of Session - 01/20/18 1100    Visit Number  3    Number of Visits  4    Date for PT Re-Evaluation  01/25/18    PT Start Time  1017    PT Stop Time  1113    PT Time Calculation (min)  56 min    Activity Tolerance  Patient tolerated treatment well;No increased pain    Behavior During Therapy  [facility] for tasks assessed/performed       No past medical history on file.  Past Surgical History:  Procedure Laterality Date  . CESAREAN SECTION  1979    There were no vitals filed for this visit.  Subjective Assessment - 01/20/18 1024    Subjective  Pt reports she continues to awaken in night (2-3x) due to LE pain.  She has been using roller on leg, "I love that", and performing exercises to help resolve pain.  She is no longer taking ibprofen. She still favors leg, especially when trying to get into her vehicle.     Patient Stated Goals  walk without pain    Currently in Pain?  Yes    Pain Score  2     Pain Location  Leg    Pain Orientation  Right    Pain Descriptors / Indicators  Dull    Aggravating Factors   prolonged positions.     Pain Relieving Factors  massage, medicine         [facility] PT Assessment - 01/20/18 0001      Assessment   Medical Diagnosis  Rt hamstring strain and knee OA    Referring Provider  Dr. Clementeen Graham    Onset Date/Surgical Date  11/11/17    Hand Dominance  Left    Next MD Visit  after therapy      Palpation   Palpation comment  Pt point tender in mid belly of Rt bicep femoris and distal semitendinosus with palpation.  Pt also tender in lateral prox calf.       [facility] Adult PT Treatment/Exercise - 01/20/18 0001      Knee/Hip  Exercises: Stretches   Passive Hamstring Stretch  Right;3 reps;30 seconds;Limitations    ITB Stretch  Right;2 reps;30 seconds    Gastroc Stretch  Right;Left;30 seconds;3 reps 2 reps wiht heel off of step    Other Knee/Hip Stretches  adductor stretch in supine with strap for RLE x 30 sec x 2 reps      Knee/Hip Exercises: Aerobic   Nustep  L4: 6 min  PTA present to discuss progress.       Knee/Hip Exercises: Supine   Bridges  Both;2 sets;10 reps eccentric lowering      Knee/Hip Exercises: Prone   Hamstring Curl  10 reps;2 sets Rt with eccentric focus, 2# on ankle      Moist Heat Therapy   Number Minutes Moist Heat  15 Minutes    Moist Heat Location  -- Rt hamstring/calf      Electrical Stimulation   Electrical Stimulation Location  prox Rt calf, distral Rt hamstring    Electrical Stimulation Action  premod to each area    Electrical Stimulation Parameters  to tolerance  Electrical Stimulation Goals  Pain;Tone      Manual Therapy   Manual Therapy  Soft tissue mobilization    Manual therapy comments  pt in prone    Soft tissue mobilization  STM to prox Rt calf and Rt distal to proximal hamstring.  Edge tool assistance to same area to reduce fascial restriction and improve mobility.             PT Education - 01/20/18 1100    Education provided  Yes    Education Details  HEP, TENs and TDN info    Person(s) Educated  Patient    Methods  Explanation;Handout    Comprehension  Verbalized understanding          PT Long Term Goals - 01/18/18 1319      PT LONG TERM GOAL #1   Title  I with HEP ( 01/25/18)     Time  3    Period  Weeks    Status  On-going      PT LONG TERM GOAL #2   Title  report =/> 75% improvement in rt lower leg pain with standing ( 01/25/18)     Time  2    Period  Weeks    Status  On-going      PT LONG TERM GOAL #3   Title  Rt hamstring strength = to lLt without pain ( 01/25/18)     Time  2    Period  Weeks    Status  On-going      PT LONG  TERM GOAL #4   Title  improve FOTO =/< 37% limited ( 01/25/18)     Time  2    Period  Weeks    Status  On-going            Plan - 01/20/18 1101    Clinical Impression Statement  Pt has had reduction in symptoms with freq use of self massage since last visit.  She was able to tolerate exercises well, reporting reduction in pain with stretches. She reported greater ease with gait after manual therapy.  Pt has persistant tightness in Rt hamstring; may benefit from session including manual therapy and DN.  Progressing towards goals.     Rehab Potential  Excellent    PT Frequency  2x / week    PT Duration  2 weeks    PT Treatment/Interventions  Dry needling;Manual techniques;Moist Heat;Taping;Patient/family education;Therapeutic activities;Ultrasound;Cryotherapy;Electrical Stimulation;Therapeutic exercise    PT Next Visit Plan  assess goals and need for additional visits; FOTO.     Consulted and Agree with Plan of Care  Patient       Patient will benefit from skilled therapeutic intervention in order to improve the following deficits and impairments:  Pain, Decreased strength, Increased muscle spasms  Visit Diagnosis: Pain in right lower leg  Muscle weakness (generalized)  Other muscle spasm     Problem List Patient Active Problem List   Diagnosis Date Noted  . 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 Atrophic vaginitis 01/05/2018  . Female stress incontinence 01/05/2018  . GAD (generalized anxiety disorder) 02/18/2017  . Osteopenia of multiple sites 02/18/2017  . Cystocele with rectocele 11/16/2016   Mayer Camel, PTA 01/20/18 4:57 PM  [facility] [place] [address] [place] [address] [place], [place], [address] Phone: [phone]   Fax:  [phone]  Name: Danielle Parsons MRN: [id number] Date of Birth: [birth year]

## 2018-01-26 ENCOUNTER — Ambulatory Visit (INDEPENDENT_AMBULATORY_CARE_PROVIDER_SITE_OTHER): Payer: Medicare Other | Admitting: Physical Therapy

## 2018-01-26 DIAGNOSIS — M6281 Muscle weakness (generalized): Secondary | ICD-10-CM

## 2018-01-26 DIAGNOSIS — M62838 Other muscle spasm: Secondary | ICD-10-CM

## 2018-01-26 DIAGNOSIS — M79661 Pain in right lower leg: Secondary | ICD-10-CM | POA: Diagnosis not present

## 2018-01-26 NOTE — Therapy (Signed)
[place], [place], [address] Phone: [phone]   Fax:  [phone]  Physical Therapy Treatment  Patient Details  Name: Danielle Parsons MRN: [id number] Date of Birth: Sep 13, 1946 Referring Provider: Dr Lynne Leader   Encounter Date: 01/26/2018  PT End of Session - 01/26/18 0859    Visit Number  4    Number of Visits  4    Date for PT Re-Evaluation  01/25/18    PT Start Time  0852    PT Stop Time  0935    PT Time Calculation (min)  43 min    Activity Tolerance  Patient tolerated treatment well       No past medical history on file.  Past Surgical History:  Procedure Laterality Date  . CESAREAN SECTION  1979    There were no vitals filed for this visit.  Subjective Assessment - 01/26/18 0855    Subjective  Pt reports she is doing a lot better.  She was very sore over the weekend after doing a lot of cleaning and now she is feeling better. Still protecting with stairs.  She has not had pain for the last 12 hours    Patient Stated Goals  walk without pain    Currently in Pain?  No/denies         [facility] PT Assessment - 01/26/18 0001      Assessment   Medical Diagnosis  Rt hamstring strain and knee OA    Referring Provider  Dr Lynne Leader    Onset Date/Surgical Date  11/11/17      Observation/Other Assessments   Focus on Therapeutic Outcomes (FOTO)   40% limited      Squat   Comments  bracing knees together was able to correct with VCs      Single Leg Stance   Comments  Rt 8sec, 10 sec and 7sec.       Strength   Right/Left Knee  -- Rt WNL and no pain.                    [facility] PT Treatment/Exercise - 01/26/18 0001      Knee/Hip Exercises: Stretches   Passive Hamstring Stretch  Both;30 seconds;2 reps    Quad Stretch  Both;2 reps;30 seconds    ITB Stretch  Both;2 reps;30 seconds with strap on supine.     Other Knee/Hip Stretches  adductor stretch in supine with strap  for bilat x 30 sec x 2 reps VC to engage core     Other Knee/Hip Stretches  butterfly stretch x 45 sec      Knee/Hip Exercises: Aerobic   Nustep  L5x6'      Knee/Hip Exercises: Standing   Other Standing Knee Exercises  2x10 sit to/from stand with focus on good lower body alignment      Knee/Hip Exercises: Supine   Heel Slides  Strengthening;Both;3 sets 8 reps with feet on ball.       Knee/Hip Exercises: Sidelying   Other Sidelying Knee/Hip Exercises  10 reps each pilates FWD/BWD kick, CW/CCW circles and taps with arc                  PT Long Term Goals - 01/26/18 0859      PT LONG TERM GOAL #1   Title  I with HEP ( 01/25/18)     Status  Achieved      PT LONG TERM GOAL #2  Title  report =/> 75% improvement in rt lower leg pain with standing ( 01/25/18)     Status  Achieved 90% improvement      PT LONG TERM GOAL #3   Title  Rt hamstring strength = to lLt without pain ( 01/25/18)     Status  Achieved      PT LONG TERM GOAL #4   Title  improve FOTO =/< 37% limited ( 01/25/18)     Status  Not Met pt making improvement, scored 40% limited, was 50%            Plan - 01/26/18 0908    Clinical Impression Statement  Danielle Parsons is very pleased with her progress, she feels like she is ready to discharge to her HEP . She has partially met her goals and has excellent progress towards the one that is not met.     PT Next Visit Plan  discharge  to HEP     Consulted and Agree with Plan of Care  Patient       Patient will benefit from skilled therapeutic intervention in order to improve the following deficits and impairments:     Visit Diagnosis: Pain in right lower leg  Muscle weakness (generalized)  Other muscle spasm     Problem List Patient Active Problem List   Diagnosis Date Noted  . 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 Atrophic vaginitis 01/05/2018  . Female stress incontinence 01/05/2018  . GAD (generalized anxiety disorder) 02/18/2017  . Osteopenia of multiple  sites 02/18/2017  . Cystocele with rectocele 11/16/2016    Jeral Pinch PT  01/26/2018, 12:07 PM  [facility] [place], [place], [address] Phone: [phone]   Fax:  [phone]  Name: Danielle Parsons MRN: [id number] Date of Birth: 1946-04-11   PHYSICAL THERAPY DISCHARGE SUMMARY  Visits from Start of Care: 4  Current functional level related to goals / functional outcomes: See above, almost all goals met   Remaining deficits: Minimal deficits, she is able to perform all activity she wishes to do.    Education / Equipment: HEP Plan: Patient agrees to discharge.  Patient goals were partially met. Patient is being discharged due to being pleased with the current functional level.  ?????    Jeral Pinch, PT 01/26/18 12:09 PM

## 2018-01-28 ENCOUNTER — Encounter: Payer: Medicare Other | Admitting: Physical Therapy

## 2018-02-16 ENCOUNTER — Encounter: Payer: Self-pay | Admitting: Family Medicine

## 2018-02-16 ENCOUNTER — Ambulatory Visit (INDEPENDENT_AMBULATORY_CARE_PROVIDER_SITE_OTHER): Payer: Medicare Other | Admitting: Family Medicine

## 2018-02-16 VITALS — BP 112/76 | HR 92 | Ht 61.0 in | Wt 149.0 lb

## 2018-02-16 DIAGNOSIS — S76311A Strain of muscle, fascia and tendon of the posterior muscle group at thigh level, right thigh, initial encounter: Secondary | ICD-10-CM | POA: Diagnosis not present

## 2018-02-16 NOTE — Patient Instructions (Signed)
Thank you for coming in today. I am here for you Recheck as needed.

## 2018-02-16 NOTE — Progress Notes (Signed)
   Danielle Parsons is a 72 y.o. female who presents to [facility] today for right hamstring pain.  Chong Sicilian was seen on April 24 for strain of the right hamstring.  She has done physical therapy and is much better.  She notes that she is done 100% better but is significantly improved from April 24.  She has resumed her normal activities and is quite satisfied with how things are going.    ROS:  As above  Exam:  BP 112/76   Pulse 92   Ht '5\' 1"'$  (1.549 m)   Wt 149 lb (67.6 kg)   BMI 28.15 kg/m  General: Well Developed, well nourished, and in no acute distress.  Neuro/Psych: Alert and oriented x3, extra-ocular muscles intact, able to move all 4 extremities, sensation grossly intact. Skin: Warm and dry, no rashes noted.  Respiratory: Not using accessory muscles, speaking in full sentences, trachea midline.  Cardiovascular: Pulses palpable, no extremity edema. Abdomen: Does not appear distended. MSK:  Right knee and hamstring normal-appearing nontender normal strength.  Normal gait.    Assessment and Plan: 72 y.o. female with right hamstring strain nearly completely resolved.  Continue home exercise program and recheck with me as needed.    No orders of the defined types were placed in this encounter.  No orders of the defined types were placed in this encounter.   Historical information moved to improve visibility of documentation.  No past medical history on file. Past Surgical History:  Procedure Laterality Date  . CESAREAN SECTION  1979   Social History   Tobacco Use  . Smoking status: Never Smoker  . Smokeless tobacco: Never Used  Substance Use Topics  . Alcohol use: No   family history includes Alzheimer's disease in her mother; Multiple myeloma in her father.  Medications: Current Outpatient Medications  Medication Sig Dispense Refill  . diclofenac sodium (VOLTAREN) 1 % GEL Apply 4 g topically 4 (four) times daily. To  affected joint. 100 g 11  . venlafaxine (EFFEXOR) 75 MG tablet Take 75 mg by mouth 2 (two) times daily.     No current facility-administered medications for this visit.    No Known Allergies    Discussed warning signs or symptoms. Please see discharge instructions. Patient expresses understanding.  CC:  Thurston Hole, NP  [address] [place], [place] [address]  Phone: [phone]  Fax: [phone]

## 2018-10-06 ENCOUNTER — Emergency Department (INDEPENDENT_AMBULATORY_CARE_PROVIDER_SITE_OTHER)
Admission: EM | Admit: 2018-10-06 | Discharge: 2018-10-06 | Disposition: A | Discharge status: Home / Self Care | Payer: Medicare Other | Source: Home / Self Care

## 2018-10-06 ENCOUNTER — Encounter: Payer: Self-pay | Admitting: Family Medicine

## 2018-10-06 ENCOUNTER — Other Ambulatory Visit: Payer: Self-pay

## 2018-10-06 DIAGNOSIS — N390 Urinary tract infection, site not specified: Secondary | ICD-10-CM

## 2018-10-06 LAB — POCT URINALYSIS DIP (MANUAL ENTRY)
Bilirubin, UA: NEGATIVE
Glucose, UA: NEGATIVE mg/dL
Ketones, POC UA: NEGATIVE mg/dL
Nitrite, UA: NEGATIVE
Protein Ur, POC: 30 mg/dL — AB
Spec Grav, UA: 1.025 (ref 1.010–1.025)
Urobilinogen, UA: 0.2 E.U./dL
pH, UA: 6 (ref 5.0–8.0)

## 2018-10-06 MED ORDER — CEPHALEXIN 500 MG PO CAPS: 500.0000 mg | ORAL_CAPSULE | Freq: Three times a day (TID) | ORAL | 0 refills | Status: DC

## 2018-10-06 NOTE — ED Provider Notes (Addendum)
[name]    CSN: [id number] Arrival date & time: 10/06/18  1653     History   Chief Complaint Chief Complaint  Patient presents with  . Urinary Frequency    HPI Danielle Parsons is a 73 y.o. female.   This is a 73 year old woman who presents for evaluation of urinary problems.  Patient has had dysuria and frequency for 2 days.  Patient denies nausea, flank pain, abdominal pain, or fever.   She is an established patient at [facility] urgent care.     History reviewed. No pertinent past medical history.  Patient Active Problem List   Diagnosis Date Noted  . 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 Atrophic vaginitis 01/05/2018  . Female stress incontinence 01/05/2018  . GAD (generalized anxiety disorder) 02/18/2017  . Osteopenia of multiple sites 02/18/2017  . Cystocele with rectocele 11/16/2016    Past Surgical History:  Procedure Laterality Date  . CESAREAN SECTION  1979    OB History   No obstetric history on file.      Home Medications    Prior to Admission medications   Medication Sig Start Date End Date Taking? Authorizing Provider  cephALEXin (KEFLEX) 500 MG capsule Take 1 capsule (500 mg total) by mouth 3 (three) times daily. 10/06/18   Robyn Haber, MD  diclofenac sodium (VOLTAREN) 1 % GEL Apply 4 g topically 4 (four) times daily. To affected joint. 01/05/18   Gregor Hams, MD  venlafaxine (EFFEXOR) 75 MG tablet Take 75 mg by mouth 2 (two) times daily.    [provider]    Family History Family History  Problem Relation Age of Onset  . Multiple myeloma Father   . Alzheimer's disease Mother     Social History Social History   Tobacco Use  . Smoking status: Never Smoker  . Smokeless tobacco: Never Used  Substance Use Topics  . Alcohol use: No  . Drug use: No     Allergies   Patient has no known allergies.   Review of Systems Review of Systems   Physical Exam Triage Vital Signs ED Triage Vitals  Enc Vitals  Group     BP      Pulse      Resp      Temp      Temp src      SpO2      Weight      Height      Head Circumference      Peak Flow      Pain Score      Pain Loc      Pain Edu?      Excl. in [place]?    No data found.  Updated Vital Signs BP 124/80 (BP Location: Right Arm)   Pulse 89   Temp (!) 97.5 F (36.4 C) (Oral)   Ht 5' (1.524 m)   Wt 69.9 kg   SpO2 97%   BMI 30.08 kg/m    Physical Exam Vitals signs and nursing note reviewed.  Constitutional:      Appearance: Normal appearance. She is normal weight.  HENT:     Head: Normocephalic.     Mouth/Throat:     Mouth: Mucous membranes are moist.  Eyes:     Conjunctiva/sclera: Conjunctivae normal.  Pulmonary:     Effort: Pulmonary effort is normal.  Musculoskeletal: Normal range of motion.  Skin:    General: Skin is warm and dry.  Neurological:     General: No  focal deficit present.     Mental Status: She is alert and oriented to person, place, and time.  Psychiatric:        Mood and Affect: Mood normal.        Behavior: Behavior normal.      UC Treatments / Results  Labs (all labs ordered are listed, but only abnormal results are displayed) Labs Reviewed  POCT URINALYSIS DIP (MANUAL ENTRY) - Abnormal; Notable for the following components:      Result Value   Clarity, UA cloudy (*)    Blood, UA trace-intact (*)    Protein Ur, POC =30 (*)    Leukocytes, UA Moderate (2+) (*)    All other components within normal limits  URINE CULTURE    EKG None  Radiology No results found.  Procedures Procedures (including critical care time)  Medications Ordered in UC Medications - No data to display  Initial Impression / Assessment and Plan / UC Course  I have reviewed the triage vital signs and the nursing notes.  Pertinent labs & imaging results that were available during my care of the patient were reviewed by me and considered in my medical decision making (see chart for details).    Final Clinical  Impressions(s) / UC Diagnoses   Final diagnoses:  Lower urinary tract infectious disease   Discharge Instructions   None    ED Prescriptions    Medication Sig Dispense Auth. Provider   cephALEXin (KEFLEX) 500 MG capsule Take 1 capsule (500 mg total) by mouth 3 (three) times daily. 20 capsule Robyn Haber, MD     Controlled Substance Prescriptions [place] Controlled Substance Registry consulted? Not Applicable   Robyn Haber, MD 10/06/18 Merlyn Albert    Robyn Haber, MD 10/06/18 [phone]

## 2018-10-06 NOTE — ED Triage Notes (Signed)
Pt c/o frequent urination and feeling of inability to completely empty her bladder.

## 2018-10-08 ENCOUNTER — Telehealth: Payer: Self-pay | Admitting: Emergency Medicine

## 2018-10-08 LAB — URINE CULTURE
MICRO NUMBER:: 96547
SPECIMEN QUALITY:: ADEQUATE

## 2018-10-08 NOTE — Telephone Encounter (Signed)
Patient informed of positive urine culture and given Cephalexin.  Per Denny Peon, according to urine culture, Cephalexin will treat infection.  Patient states that she will continue antbs as prescribed.  Once complete, if no better, will f/u with PCP.  Patient voices understanding.

## 2018-11-07 ENCOUNTER — Other Ambulatory Visit: Payer: Self-pay

## 2018-11-07 ENCOUNTER — Emergency Department (INDEPENDENT_AMBULATORY_CARE_PROVIDER_SITE_OTHER)
Admission: EM | Admit: 2018-11-07 | Discharge: 2018-11-07 | Disposition: A | Discharge status: Home / Self Care | Payer: Medicare Other | Source: Home / Self Care

## 2018-11-07 ENCOUNTER — Encounter: Payer: Self-pay | Admitting: *Deleted

## 2018-11-07 DIAGNOSIS — N3090 Cystitis, unspecified without hematuria: Secondary | ICD-10-CM | POA: Diagnosis not present

## 2018-11-07 DIAGNOSIS — R300 Dysuria: Secondary | ICD-10-CM

## 2018-11-07 LAB — POCT URINALYSIS DIP (MANUAL ENTRY)
BILIRUBIN UA: NEGATIVE
Glucose, UA: NEGATIVE mg/dL
Ketones, POC UA: NEGATIVE mg/dL
NITRITE UA: NEGATIVE
Protein Ur, POC: 30 mg/dL — AB
Spec Grav, UA: 1.02 (ref 1.010–1.025)
Urobilinogen, UA: 0.2 E.U./dL
pH, UA: 5.5 (ref 5.0–8.0)

## 2018-11-07 MED ORDER — NITROFURANTOIN MONOHYD MACRO 100 MG PO CAPS: 100.0000 mg | ORAL_CAPSULE | Freq: Two times a day (BID) | ORAL | 0 refills | Status: DC

## 2018-11-07 MED ORDER — PHENAZOPYRIDINE HCL 200 MG PO TABS: 200.0000 mg | ORAL_TABLET | Freq: Three times a day (TID) | ORAL | 0 refills | Status: DC

## 2018-11-07 NOTE — ED Triage Notes (Signed)
Patient c/o 1 week of urinary frequency, discomfort and inability to completely empty her bladder. Noted spotting this AM. Denies fever or flank pain.

## 2018-11-07 NOTE — ED Provider Notes (Signed)
[name]    CSN: [id number] Arrival date & time: 11/07/18  1218     History   Chief Complaint Chief Complaint  Patient presents with  . Polyuria  . Dysuria    HPI Danielle Parsons is a 73 y.o. female.   HPI Patient was treated for a urinary tract infection a month ago.  It cleared completely.  However about 4 days ago she started having urinary frequency and discomfort.  She has had a lot of nocturia and has had to wear a pad at night.  There is a tiny bit of blood on the pad this morning which she thinks came from her urine.  She has never had any postmenopausal bleeding.  She is not sexually involved, with her husband having died 3 years ago.  She has not had any systemic symptoms such as fever chills or nausea or vomiting.  She has had urinary tract infections at times in her life.  About 25 years ago she had a lot of trouble with them, but much less recently until this year. History reviewed. No pertinent past medical history.  Patient Active Problem List   Diagnosis Date Noted  . 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 Atrophic vaginitis 01/05/2018  . Female stress incontinence 01/05/2018  . GAD (generalized anxiety disorder) 02/18/2017  . Osteopenia of multiple sites 02/18/2017  . Cystocele with rectocele 11/16/2016    Past Surgical History:  Procedure Laterality Date  . CESAREAN SECTION  1979    OB History   No obstetric history on file.      Home Medications    Prior to Admission medications   Medication Sig Start Date End Date Taking? Authorizing Provider  diclofenac sodium (VOLTAREN) 1 % GEL Apply 4 g topically 4 (four) times daily. To affected joint. 01/05/18   Gregor Hams, MD  nitrofurantoin, macrocrystal-monohydrate, (MACROBID) 100 MG capsule Take 1 capsule (100 mg total) by mouth 2 (two) times daily. 11/07/18   Posey Boyer, MD  phenazopyridine (PYRIDIUM) 200 MG tablet Take 1 tablet (200 mg total) by mouth 3 (three) times daily. 11/07/18   Posey Boyer, MD  venlafaxine (EFFEXOR) 75 MG tablet Take 75 mg by mouth 2 (two) times daily.    [provider]    Family History Family History  Problem Relation Age of Onset  . Multiple myeloma Father   . Alzheimer's disease Mother     Social History Social History   Tobacco Use  . Smoking status: Never Smoker  . Smokeless tobacco: Never Used  Substance Use Topics  . Alcohol use: No  . Drug use: No     Allergies   Patient has no known allergies.   Review of Systems Review of Systems Constitutional: Unremarkable Cardiorespiratory: Unremarkable GI: Unremarkable GU: As above Dermatologic: Unremarkable  Physical Exam Triage Vital Signs ED Triage Vitals [11/07/18 1232]  Enc Vitals Group     BP 119/79     Pulse Rate 88     Resp 14     Temp 98 F (36.7 C)     Temp Source Oral     SpO2 98 %     Weight 150 lb (68 kg)     Height      Head Circumference      Peak Flow      Pain Score 0     Pain Loc      Pain Edu?      Excl. in [place]?  No data found.  Updated Vital Signs BP 119/79 (BP Location: Right Arm)   Pulse 88   Temp 98 F (36.7 C) (Oral)   Resp 14   Wt 68 kg   SpO2 98%   BMI 29.29 kg/m   Visual Acuity Right Eye Distance:   Left Eye Distance:   Bilateral Distance:    Right Eye Near:   Left Eye Near:    Bilateral Near:     Physical Exam Healthy appearing pleasant lady in no acute distress.  Abdomen is soft without mass or tenderness.  No CVA tenderness.  UC Treatments / Results  Labs (all labs ordered are listed, but only abnormal results are displayed) Labs Reviewed  POCT URINALYSIS DIP (MANUAL ENTRY) - Abnormal; Notable for the following components:      Result Value   Clarity, UA cloudy (*)    Blood, UA trace-intact (*)    Protein Ur, POC =30 (*)    Leukocytes, UA Moderate (2+) (*)    All other components within normal limits  URINE CULTURE   Moderate leukocytes in urine. EKG None  Radiology No results  found.  Procedures Procedures (including critical care time)  Medications Ordered in UC Medications - No data to display  Initial Impression / Assessment and Plan / UC Course  I have reviewed the triage vital signs and the nursing notes.  Pertinent labs & imaging results that were available during my care of the patient were reviewed by me and considered in my medical decision making (see chart for details).     Consistent with acute cystitis.  Will treat with Macrodantin since she was treated with cephalexin last time. Final Clinical Impressions(s) / UC Diagnoses   Final diagnoses:  Dysuria  Cystitis     Discharge Instructions     Drink plenty of fluids to keep your bladder flushed out well.  Take the nitrofurantoin antibiotic 1 twice daily with breakfast and supper.  You can take some Pyridium 200 mg once or twice daily which just numbs the bladder but helps the frequency and discomfort.  It will make your urine a dark orange color and can stain white clothing.  Return if further problems.  Especially if you develop fever or chills or nausea and vomiting or other systemic symptoms get rechecked.    ED Prescriptions    Medication Sig Dispense Auth. Provider   nitrofurantoin, macrocrystal-monohydrate, (MACROBID) 100 MG capsule Take 1 capsule (100 mg total) by mouth 2 (two) times daily. 14 capsule Posey Boyer, MD   phenazopyridine (PYRIDIUM) 200 MG tablet Take 1 tablet (200 mg total) by mouth 3 (three) times daily. 6 tablet Posey Boyer, MD     Controlled Substance Prescriptions [place] Controlled Substance Registry consulted? No   Posey Boyer, MD 11/07/18 [phone]

## 2018-11-07 NOTE — Discharge Instructions (Addendum)
Drink plenty of fluids to keep your bladder flushed out well.  Take the nitrofurantoin antibiotic 1 twice daily with breakfast and supper.  You can take some Pyridium 200 mg once or twice daily which just numbs the bladder but helps the frequency and discomfort.  It will make your urine a dark orange color and can stain white clothing.  Return if further problems.  Especially if you develop fever or chills or nausea and vomiting or other systemic symptoms get rechecked.

## 2018-11-09 ENCOUNTER — Telehealth: Payer: Self-pay | Admitting: Emergency Medicine

## 2018-11-09 LAB — URINE CULTURE
MICRO NUMBER:: 234464
SPECIMEN QUALITY:: ADEQUATE

## 2018-11-09 NOTE — Telephone Encounter (Signed)
Hope you are feeling better, bacteria growth present, finish all meds, increase fluids, no need to call back unless you are not getting better.

## 2018-12-29 IMAGING — DX DG KNEE AP/LAT W/ SUNRISE*R*
3 series · 3 of 3 positions shown · non-contrast
Comparison: None.

CLINICAL DATA: Right knee pain, no known injury, initial encounter

EXAM:
RIGHT KNEE 3 VIEWS

[knee ap]
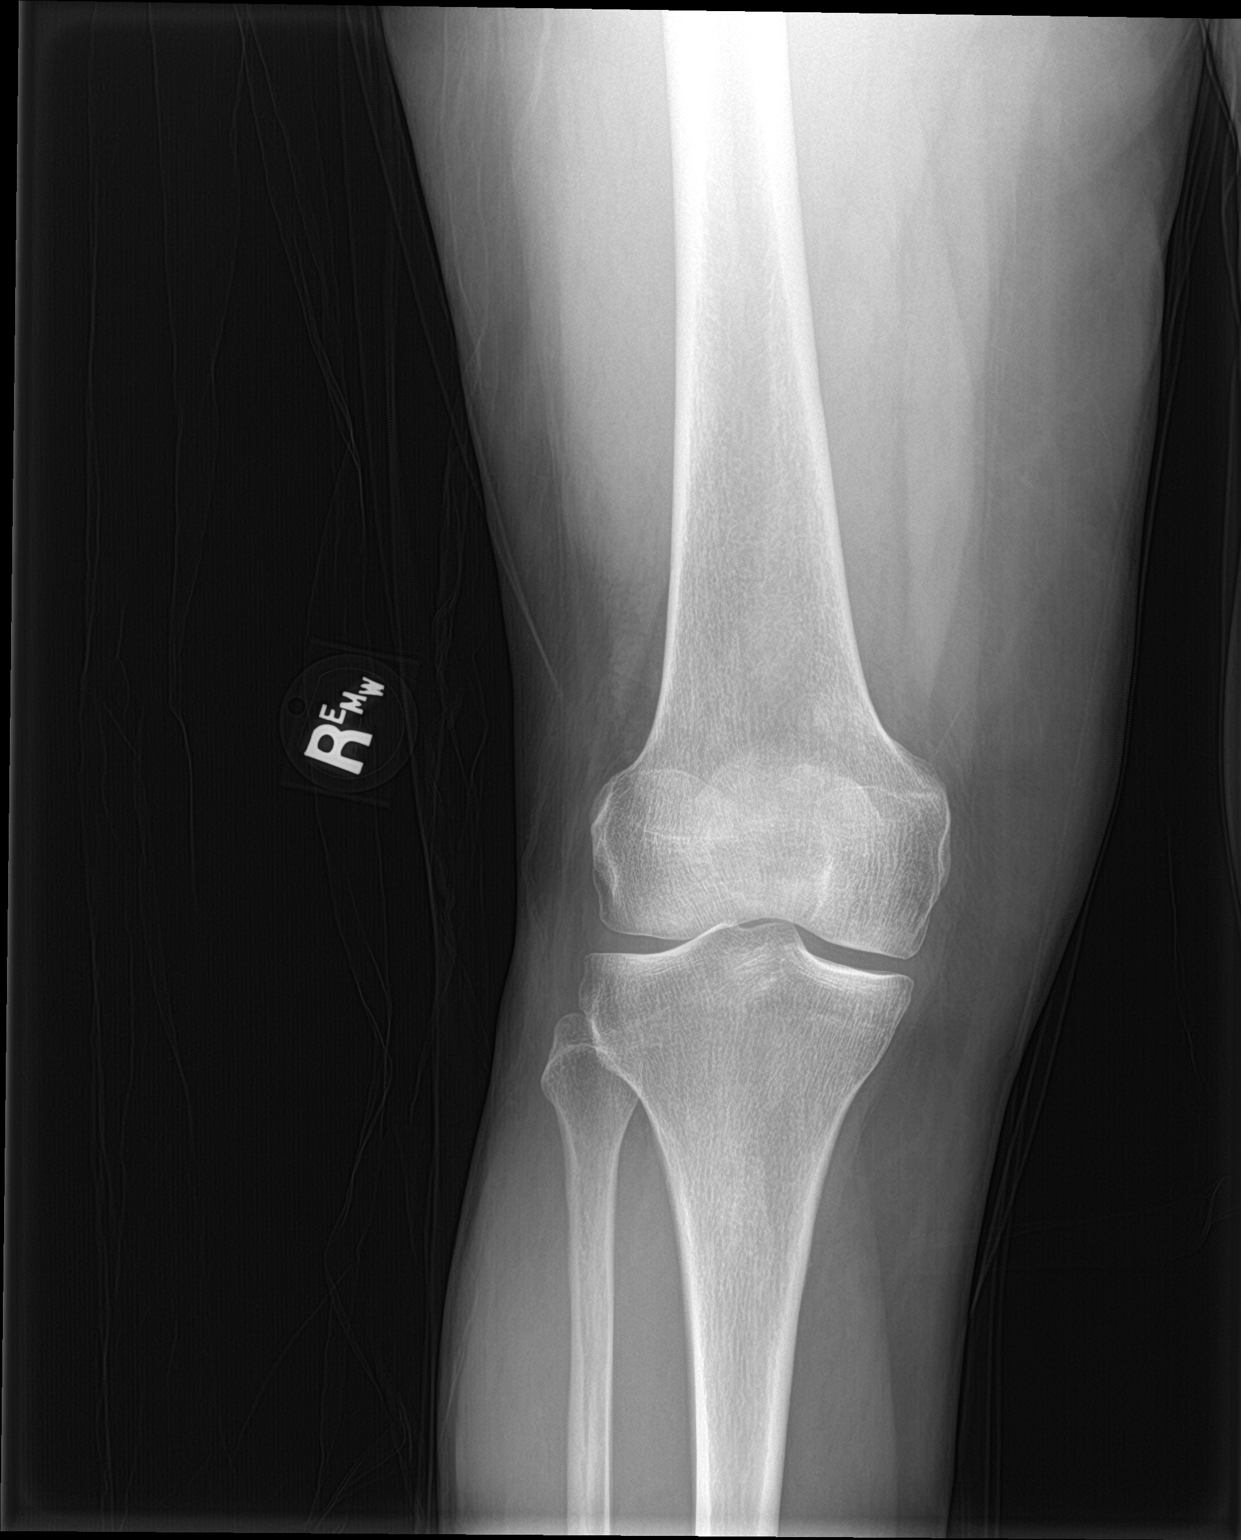

[knee lat]
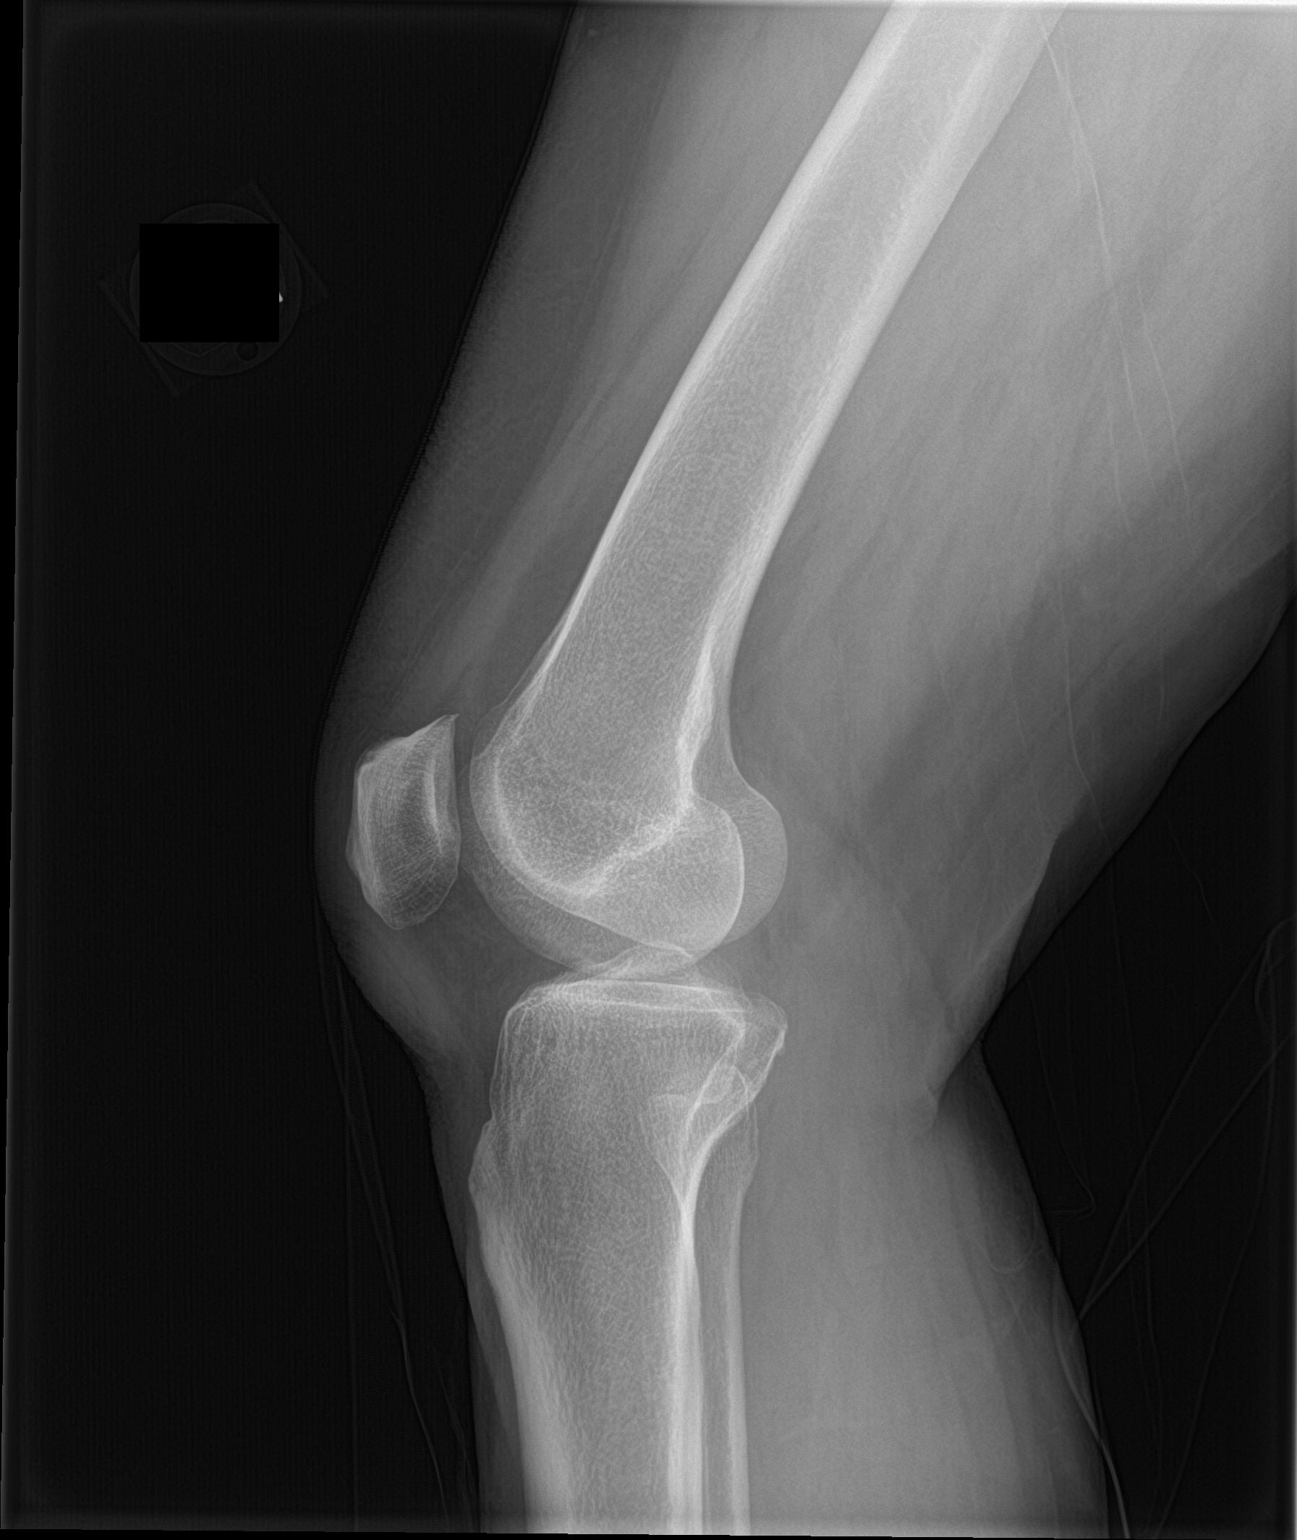

[knee sunrise]
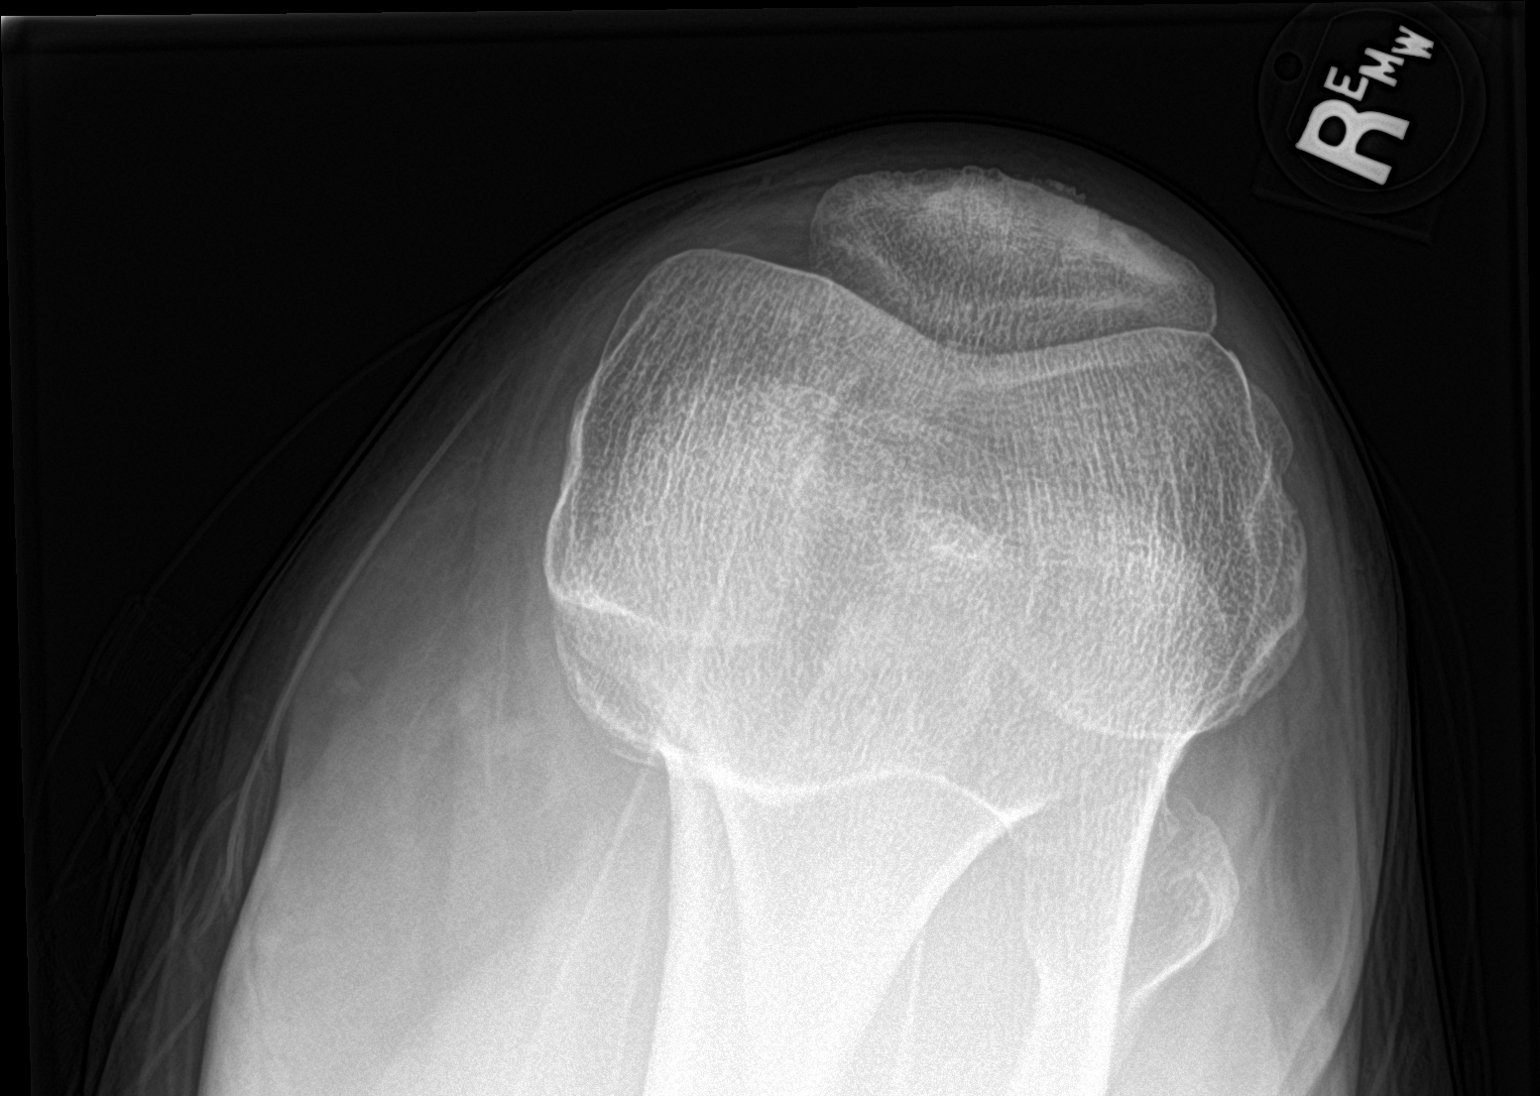

[3 of 3 positions shown; findings below may reference images not displayed]

FINDINGS: No acute fracture or dislocation is noted. Mild medial joint space
narrowing is seen. No joint effusion or other soft tissue
abnormality is noted.
IMPRESSION: Mild degenerative change without acute abnormality.

## 2019-09-17 ENCOUNTER — Encounter: Payer: Self-pay | Admitting: Emergency Medicine

## 2019-09-17 ENCOUNTER — Emergency Department (INDEPENDENT_AMBULATORY_CARE_PROVIDER_SITE_OTHER): Payer: Medicare Other

## 2019-09-17 ENCOUNTER — Emergency Department (INDEPENDENT_AMBULATORY_CARE_PROVIDER_SITE_OTHER)
Admission: EM | Admit: 2019-09-17 | Discharge: 2019-09-17 | Disposition: A | Discharge status: Home / Self Care | Payer: Medicare Other | Source: Home / Self Care | Attending: Family Medicine | Admitting: Family Medicine

## 2019-09-17 ENCOUNTER — Other Ambulatory Visit: Payer: Self-pay

## 2019-09-17 DIAGNOSIS — M25511 Pain in right shoulder: Secondary | ICD-10-CM

## 2019-09-17 DIAGNOSIS — M79621 Pain in right upper arm: Secondary | ICD-10-CM

## 2019-09-17 DIAGNOSIS — S46001A Unspecified injury of muscle(s) and tendon(s) of the rotator cuff of right shoulder, initial encounter: Secondary | ICD-10-CM

## 2019-09-17 HISTORY — DX: Personal history of urinary (tract) infections: Z87.440

## 2019-09-17 MED ORDER — HYDROCODONE-ACETAMINOPHEN 5-325 MG PO TABS: ORAL_TABLET | ORAL | 0 refills | Status: DC

## 2019-09-17 NOTE — ED Provider Notes (Signed)
[name]    CSN: [id number] Arrival date & time: 09/17/19  0933      History   Chief Complaint Chief Complaint  Patient presents with  . Arm Pain    right    HPI Danielle Parsons is a 74 y.o. female.   2.5 months ago patient tripped over uneven concrete, falling on her right side.  She suffered a laceration to her right elbow (now healed), and complains of persistent pain and very limited motion of her right shoulder.  She is unable to abduct her right shoulder, and pain radiates to her right upper arm.  Her shoulder pain awakens her at night.  The history is provided by the patient.  Shoulder Injury This is a new problem. Episode onset: 2.5 months ago. The problem occurs constantly. The problem has been gradually worsening. Pertinent negatives include no chest pain and no shortness of breath. Associated symptoms comments: Pain in right humerus. Exacerbated by: shoulder movement. Nothing relieves the symptoms. Treatments tried: Ibuprofen. The treatment provided mild relief.    Past Medical History:  Diagnosis Date  . H/O bladder infections     Patient Active Problem List   Diagnosis Date Noted  . 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 Atrophic vaginitis 01/05/2018  . Female stress incontinence 01/05/2018  . GAD (generalized anxiety disorder) 02/18/2017  . Osteopenia of multiple sites 02/18/2017  . Cystocele with rectocele 11/16/2016    Past Surgical History:  Procedure Laterality Date  . CESAREAN SECTION  1979    OB History   No obstetric history on file.      Home Medications    Prior to Admission medications   Medication Sig Start Date End Date Taking? Authorizing Provider  cephALEXin (KEFLEX) 500 MG capsule Take 500 mg by mouth every morning.   Yes [provider]  diclofenac sodium (VOLTAREN) 1 % GEL Apply 4 g topically 4 (four) times daily. To affected joint. 01/05/18   Gregor Hams, MD  HYDROcodone-acetaminophen (NORCO/VICODIN) 5-325 MG  tablet Take one by mouth at bedtime as needed for pain 09/17/19   Kandra Nicolas, MD  nitrofurantoin, macrocrystal-monohydrate, (MACROBID) 100 MG capsule Take 1 capsule (100 mg total) by mouth 2 (two) times daily. 11/07/18   Posey Boyer, MD  phenazopyridine (PYRIDIUM) 200 MG tablet Take 1 tablet (200 mg total) by mouth 3 (three) times daily. 11/07/18   Posey Boyer, MD  venlafaxine (EFFEXOR) 75 MG tablet Take 75 mg by mouth 2 (two) times daily.    [provider]    Family History Family History  Problem Relation Age of Onset  . Multiple myeloma Father   . Alzheimer's disease Mother     Social History Social History   Tobacco Use  . Smoking status: Never Smoker  . Smokeless tobacco: Never Used  Substance Use Topics  . Alcohol use: No  . Drug use: No     Allergies   Patient has no known allergies.   Review of Systems Review of Systems  Respiratory: Negative for shortness of breath.   Cardiovascular: Negative for chest pain.  Musculoskeletal: Negative for neck pain.  All other systems reviewed and are negative.    Physical Exam Triage Vital Signs ED Triage Vitals  Enc Vitals Group     BP 09/17/19 1004 100/67     Pulse Rate 09/17/19 1004 82     Resp 09/17/19 1004 18     Temp 09/17/19 1004 99 F (37.2 C)  Temp Source 09/17/19 1004 Oral     SpO2 09/17/19 1004 98 %     Weight 09/17/19 1005 150 lb (68 kg)     Height 09/17/19 1005 5' (1.524 m)     Head Circumference --      Peak Flow --      Pain Score 09/17/19 1005 4     Pain Loc --      Pain Edu? --      Excl. in [place]? --    No data found.  Updated Vital Signs BP 100/67 (BP Location: Left Arm)   Pulse 82   Temp 99 F (37.2 C) (Oral)   Resp 18   Ht 5' (1.524 m)   Wt 68 kg   SpO2 98%   BMI 29.29 kg/m   Visual Acuity Right Eye Distance:   Left Eye Distance:   Bilateral Distance:    Right Eye Near:   Left Eye Near:    Bilateral Near:     Physical Exam Vitals and nursing note  reviewed.  Constitutional:      General: She is not in acute distress. HENT:     Head: Normocephalic.  Eyes:     Pupils: Pupils are equal, round, and reactive to light.  Cardiovascular:     Rate and Rhythm: Normal rate.     Heart sounds: Normal heart sounds.  Pulmonary:     Breath sounds: Normal breath sounds.  Musculoskeletal:     Right shoulder: Tenderness and bony tenderness present. No swelling, deformity or crepitus. Decreased range of motion. Decreased strength. Normal pulse.       Arms:     Cervical back: Normal range of motion and neck supple. No tenderness.     Comments: Right shoulder has decreased external rotation and strength.  There is tenderness to palpation over the insertion of the biceps tendon and subacromial bursa.   She cannot actively abduct more than 30 degrees from vertical.  Passive abduction is limited to about 20 degrees from horizontal.  Apley's, Hawkins-Kennedy, and Empty can tests are abnormal.  Internal rotational strength is decreased slightly. There is vague tenderness to palpation over the upper humerus. Distal neurovascular function is intact.    Skin:    General: Skin is warm and dry.  Neurological:     Mental Status: She is alert.      UC Treatments / Results  Labs (all labs ordered are listed, but only abnormal results are displayed) Labs Reviewed - No data to display  EKG   Radiology DG Shoulder Right  Result Date: 09/17/2019 CLINICAL DATA:  Pt states fall in October has left her with rt shoulder weakness and some pain when moving, cannot really use her rt arm EXAM: RIGHT HUMERUS - 2+ VIEW; RIGHT SHOULDER - 2+ VIEW COMPARISON:  None. FINDINGS: There is no evidence of fracture or other focal bone lesions in the right shoulder or humerus. No significant degenerative change. Visualized portions of the right lung and ribs are unremarkable. Soft tissues are unremarkable. IMPRESSION: Negative radiographs of the right shoulder and right humerus.  Electronically Signed   By: Audie Pinto M.D.   On: 09/17/2019 11:32   DG Humerus Right  Result Date: 09/17/2019 CLINICAL DATA:  Pt states fall in October has left her with rt shoulder weakness and some pain when moving, cannot really use her rt arm EXAM: RIGHT HUMERUS - 2+ VIEW; RIGHT SHOULDER - 2+ VIEW COMPARISON:  None. FINDINGS: There is no evidence of fracture  or other focal bone lesions in the right shoulder or humerus. No significant degenerative change. Visualized portions of the right lung and ribs are unremarkable. Soft tissues are unremarkable. IMPRESSION: Negative radiographs of the right shoulder and right humerus. Electronically Signed   By: Audie Pinto M.D.   On: 09/17/2019 11:32    Procedures Procedures (including critical care time)  Medications Ordered in UC Medications - No data to display  Initial Impression / Assessment and Plan / UC Course  I have reviewed the triage vital signs and the nursing notes.  Pertinent labs & imaging results that were available during my care of the patient were reviewed by me and considered in my medical decision making (see chart for details).    No evidence fracture Rx for Lortab at bedtime prn (#5, no refill). Controlled Substance Prescriptions I have consulted the [place] Controlled Substances Registry for this patient, and feel the risk/benefit ratio today is favorable for proceeding with this prescription for a controlled  .  Followup with Dr. Aundria Mems ([facility]) as soon as possible for further evaluation/treatment.   Final Clinical Impressions(s) / UC Diagnoses   Final diagnoses:  Rotator cuff injury, right, initial encounter     Discharge Instructions     Apply ice pack for 20 to 30 minutes, 2 to 3 times daily  Continue until pain decreases.  May take Ibuprofen 224m, 3 to 4 tabs every 8 hours with food.  Begin range of motion and stretching exercises as tolerated.    ED Prescriptions     Medication Sig Dispense Auth. Provider   HYDROcodone-acetaminophen (NORCO/VICODIN) 5-325 MG tablet Take one by mouth at bedtime as needed for pain 5 tablet BKandra Nicolas MD        BKandra Nicolas MD 09/18/19 [phone]

## 2019-09-17 NOTE — ED Triage Notes (Signed)
Patient having intermittent right arm pain since a fall she took in 10/20. Difficult to sleep on arm. Has been taking ibuprofen prn. Wanting evaluation. Has not had influenza vacc this season. No known exposure to covid positive person.

## 2019-09-17 NOTE — Discharge Instructions (Signed)
Apply ice pack for 20 to 30 minutes, 2 to 3 times daily  Continue until pain decreases.  May take Ibuprofen 200mg , 3 to 4 tabs every 8 hours with food.  Begin range of motion and stretching exercises as tolerated.

## 2019-09-20 ENCOUNTER — Encounter: Payer: Medicare Other | Admitting: Sports Medicine

## 2019-09-27 ENCOUNTER — Ambulatory Visit (INDEPENDENT_AMBULATORY_CARE_PROVIDER_SITE_OTHER): Payer: Medicare Other | Admitting: Sports Medicine

## 2019-09-27 ENCOUNTER — Other Ambulatory Visit: Payer: Self-pay

## 2019-09-27 DIAGNOSIS — M25521 Pain in right elbow: Secondary | ICD-10-CM | POA: Diagnosis not present

## 2019-09-27 DIAGNOSIS — M25511 Pain in right shoulder: Secondary | ICD-10-CM

## 2019-09-27 DIAGNOSIS — G8929 Other chronic pain: Secondary | ICD-10-CM

## 2019-09-27 MED ORDER — MELOXICAM 15 MG PO TABS: ORAL_TABLET | ORAL | 3 refills | Status: DC

## 2019-09-27 NOTE — Progress Notes (Signed)
    Procedures performed today:    None.  Independent interpretation of tests performed by another provider:   I personally reviewed her shoulder and elbow x-rays, she has acromioclavicular osteoarthritis, she also has flattening of the radial head at the proximal radial ulnar joint consistent with osteoarthritis.  Impression and Recommendations:    Right shoulder pain Danielle Parsons had a fall about 3 months ago, since then she is had pain in her right shoulder, worse over the deltoid and worse with abduction. She has impingement signs on exam today. X-rays show acromioclavicular osteoarthritis. Adding meloxicam and formal physical therapy, we will do this for 6 weeks before considering injections or an MRI.  Right elbow pain Just as below she had a fall about 3 months ago. Pain at the right elbow, she has about 7 to 10 degrees of extension lag. X-rays did show osteoarthritis of the proximal radial ulnar joint. She is going to do meloxicam, formal physical therapy, if no better in 4 to 6 weeks we will consider an elbow joint injection.     ___________________________________________ Ihor Austin. Benjamin Stain, M.D., ABFM., CAQSM. Primary Care and Sports Medicine [facility]  Adjunct Instructor of Family Medicine  [employer]

## 2019-09-27 NOTE — Assessment & Plan Note (Signed)
Just as below she had a fall about 3 months ago. Pain at the right elbow, she has about 7 to 10 degrees of extension lag. X-rays did show osteoarthritis of the proximal radial ulnar joint. She is going to do meloxicam, formal physical therapy, if no better in 4 to 6 weeks we will consider an elbow joint injection.

## 2019-09-27 NOTE — Assessment & Plan Note (Signed)
Danielle Parsons had a fall about 3 months ago, since then she is had pain in her right shoulder, worse over the deltoid and worse with abduction. She has impingement signs on exam today. X-rays show acromioclavicular osteoarthritis. Adding meloxicam and formal physical therapy, we will do this for 6 weeks before considering injections or an MRI.

## 2019-09-29 ENCOUNTER — Ambulatory Visit: Payer: Medicare Other | Admitting: Physical Therapy

## 2019-10-06 ENCOUNTER — Ambulatory Visit: Payer: Medicare Other | Attending: Internal Medicine

## 2019-10-06 DIAGNOSIS — Z23 Encounter for immunization: Secondary | ICD-10-CM | POA: Insufficient documentation

## 2019-10-06 NOTE — Progress Notes (Signed)
   Covid-19 Vaccination Clinic  Name:  Danielle Parsons    MRN: [id number] DOB: [birth year]  10/06/2019  Danielle Parsons was observed post Covid-19 immunization for 15 minutes without incidence. She was provided with Vaccine Information Sheet and instruction to access the V-Safe system.   Danielle Parsons was instructed to call 911 with any severe reactions post vaccine: Marland Kitchen Difficulty breathing  . Swelling of your face and throat  . A fast heartbeat  . A bad rash all over your body  . Dizziness and weakness    Immunizations Administered    Name Date Dose VIS Date Route   Pfizer COVID-19 Vaccine 10/06/2019  1:53 PM 0.3 mL 08/25/2019 Intramuscular   Manufacturer: [employer], [employer]   Lot: [id number]   NDC: [address]-7

## 2019-10-13 ENCOUNTER — Other Ambulatory Visit: Payer: Self-pay

## 2019-10-13 ENCOUNTER — Encounter: Payer: Self-pay | Admitting: Rehabilitative and Restorative Service Providers"

## 2019-10-13 ENCOUNTER — Ambulatory Visit (INDEPENDENT_AMBULATORY_CARE_PROVIDER_SITE_OTHER): Payer: Medicare Other | Admitting: Rehabilitative and Restorative Service Providers"

## 2019-10-13 VITALS — BP 120/70

## 2019-10-13 DIAGNOSIS — M25511 Pain in right shoulder: Secondary | ICD-10-CM | POA: Diagnosis not present

## 2019-10-13 DIAGNOSIS — M6281 Muscle weakness (generalized): Secondary | ICD-10-CM

## 2019-10-13 DIAGNOSIS — R29898 Other symptoms and signs involving the musculoskeletal system: Secondary | ICD-10-CM | POA: Diagnosis not present

## 2019-10-13 NOTE — Therapy (Signed)
[facility] [place], [place], [address] Phone: [phone]   Fax:  [phone]  Physical Therapy Evaluation  Patient Details  Name: Danielle Parsons MRN: [id number] Date of Birth: 03/28/1946 Referring Provider (PT): Silverio Decamp, MD   Encounter Date: 10/13/2019  PT End of Session - 10/13/19 1100    Visit Number  1    Number of Visits  12    Date for PT Re-Evaluation  11/27/19    Authorization Type  medicare    PT Start Time  0930    PT Stop Time  7673    PT Time Calculation (min)  44 min       Past Medical History:  Diagnosis Date  . H/O bladder infections     Past Surgical History:  Procedure Laterality Date  . CESAREAN SECTION  1979    Vitals:   10/13/19 0930  BP: 120/70     Subjective Assessment - 10/13/19 0930    Subjective  The patient reports onset of R UE pain after a fall in October.  She landed on her elbow and had an incision that healed.  Two weeks after her fall, she started with R UE pain that is in R parascapular region, mid humerus and forearm.  Since seeing Dr. Darene Lamer, her pain has reduced from 7/10 down to 4/10 and she is using the arm more.    Pertinent History  none    Patient Stated Goals  reduce pain and "be the way I was before I hit the concrete."    Currently in Pain?  Yes    Pain Score  4     Pain Location  Arm    Pain Orientation  Right    Pain Onset  More than a month ago    Pain Frequency  Constant    Aggravating Factors   reaching to back seat to pick up purse    Pain Relieving Factors  medication         [facility] PT Assessment - 10/13/19 0940      Assessment   Medical Diagnosis  M25.511,G89.29 (ICD-10-CM) - Chronic right shoulder pain    Referring Provider (PT)  Silverio Decamp, MD    Onset Date/Surgical Date  --   06/2019   Hand Dominance  Left    Prior Therapy  none      Precautions   Precautions  None      Restrictions   Weight Bearing  Restrictions  No      Balance Screen   Has the patient fallen in the past 6 months  Yes    How many times?  1    Has the patient had a decrease in activity level because of a fear of falling?   No    Is the patient reluctant to leave their home because of a fear of falling?   No      Home Film/video editor residence      Prior Function   Level of Independence  Independent      Observation/Other Assessments   Focus on Therapeutic Outcomes (FOTO)   77% (23% limited)      Sensation   Light Touch  Appears Intact      Posture/Postural Control   Posture/Postural Control  Postural limitations    Postural Limitations  Rounded Shoulders      ROM / Strength   AROM / PROM / Strength  AROM;Strength  AROM   Overall AROM   Deficits    Overall AROM Comments  Can get an achy sensation in forearm    AROM Assessment Site  Cervical;Shoulder;Elbow;Forearm    Right/Left Shoulder  Right;Left    Right Shoulder Flexion  125 Degrees   makes it feel like she just got a flu shot/ achy   Right Shoulder ABduction  115 Degrees   with pain   Right Shoulder Internal Rotation  0 Degrees   *painful*   Right Shoulder External Rotation  80 Degrees    Right Shoulder Horizontal ABduction  -20 Degrees   from frontal plane; remains in scaption   Right Shoulder Horizontal  ADduction  --   able to reach across body to touch left shoulder   Left Shoulder Flexion  160 Degrees    Right/Left Elbow  Right;Left    Right Elbow Flexion  145    Right Elbow Extension  -30   in supine can get to near full extension   Left Elbow Flexion  145    Left Elbow Extension  0    Right/Left Forearm  Right;Left   wrists appear symmetrical for ROM   Cervical Flexion  WFLs    Cervical Extension  WFLs    Cervical - Right Side Bend  WFLs   notes tightness   Cervical - Left Side Bend  WFLs   notes tightness   Cervical - Right Rotation  approximately 65 degrees    Cervical - Left Rotation   approximately 65 degrees      Strength   Overall Strength  Deficits    Strength Assessment Site  Shoulder;Elbow;Forearm;Wrist    Right/Left Shoulder  Right;Left    Right Shoulder Flexion  3+/5    Right Shoulder ABduction  3+/5    Left Shoulder Flexion  5/5    Left Shoulder ABduction  5/5    Right/Left Elbow  Right;Left    Right Elbow Flexion  5/5    Right Elbow Extension  5/5    Left Elbow Flexion  5/5    Left Elbow Extension  5/5    Right/Left Forearm  Right;Left    Right Forearm Pronation  5/5    Right Forearm Supination  5/5    Left Forearm Pronation  5/5    Left Forearm Supination  5/5    Right/Left Wrist  Right;Left    Right Wrist Flexion  5/5    Right Wrist Extension  5/5    Left Wrist Flexion  5/5    Left Wrist Extension  5/5      Special Tests    Special Tests  --    Rotator Cuff Impingment tests  --    Laxity/Instability   --                Objective measurements completed on examination: See above findings.      [facility] Adult PT Treatment/Exercise - 10/13/19 0940      Exercises   Exercises  Shoulder;Elbow      Elbow Exercises   Other elbow exercises  elbow extension towel roll under distal humerus to relax 2 minutes to straighten the elbow      Shoulder Exercises: Supine   Horizontal ABduction  AAROM;Both;10 reps    External Rotation  AAROM;Both;10 reps    Flexion  AAROM;Both;10 reps      Shoulder Exercises: Stretch   Other Shoulder Stretches  door frame stretch standing  PT Education - 10/13/19 1059    Education Details  HEP    Person(s) Educated  Patient    Methods  Explanation;Demonstration;Handout    Comprehension  Verbalized understanding;Returned demonstration          PT Long Term Goals - 10/13/19 1101      PT LONG TERM GOAL #1   Title  The patient will be indep with HEP.    Time  6    Period  Weeks    Target Date  11/27/19      PT LONG TERM GOAL #2   Title  The patient will reduce limitation per FOTO  from 23% limitation up to 18% limitation.    Time  6    Period  Weeks    Target Date  11/27/19      PT LONG TERM GOAL #3   Title  The patient will  improve R elbow extension to -10 degreees extension in sitting.    Time  6    Period  Weeks    Target Date  11/27/19      PT LONG TERM GOAL #4   Title  The patient will improve R shoulder strenth to 4+/5 to demo improved functional use.    Time  6    Period  Weeks    Target Date  11/27/19      PT LONG TERM GOAL #5   Title  The patient will move R shoulder to end range flexion without pain.    Time  6    Period  Weeks    Target Date  11/27/19             Plan - 10/13/19 1115    Clinical Impression Statement  The patient is a 74 year old female presenting to OP physical therapy with onset of right UE pain s/p fall in October 2020.  She currently has impairments of dec'd R elbow extension, pain with movement of R shoulder, dec'd R shoulder strength, dec'd R shoulder ROM limiting ADLs and IADLs performance.  PT to address to return to prior level of function.    Examination-Participation Restrictions  Cleaning    Stability/Clinical Decision Making  Stable/Uncomplicated    Clinical Decision Making  Low    Rehab Potential  Good    PT Frequency  2x / week    PT Duration  6 weeks    PT Treatment/Interventions  ADLs/Self Care Home Management;Therapeutic activities;Therapeutic exercise;Neuromuscular re-education;Electrical Stimulation;Cryotherapy;Iontophoresis 4mg /ml Dexamethasone;Moist Heat;Ultrasound;Patient/family education;Taping;Dry needling;Passive range of motion;Manual techniques    PT Next Visit Plan  progress HEP focusing on R shoulder isometrics or t-band to tolerance, add weight to elbow extension supine stretch, further assess posterior capsule integrity due to mechanism of injury (everything irritable on exam today), assess MMT on specific RTC musculature.    PT Home Exercise Plan  Access Code: [email or web address]    Consulted and Agree  with Plan of Care  Patient       Patient will benefit from skilled therapeutic intervention in order to improve the following deficits and impairments:  Pain, Postural dysfunction, Decreased strength, Decreased range of motion  Visit Diagnosis: Acute pain of right shoulder  Muscle weakness (generalized)  Other symptoms and signs involving the musculoskeletal system     Problem List Patient Active Problem List   Diagnosis Date Noted  . Right elbow pain 09/27/2019  . Right shoulder pain 09/27/2019  . 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 Atrophic vaginitis 01/05/2018  . Female stress incontinence  01/05/2018  . GAD (generalized anxiety disorder) 02/18/2017  . Osteopenia of multiple sites 02/18/2017  . Cystocele with rectocele 11/16/2016    Deniah Saia, PT 10/13/2019, 11:19 AM  [facility] [address] [place], [place], [address] Phone: [phone]   Fax:  [phone]  Name: Danielle Parsons MRN: [id number] Date of Birth: Nov 05, 1945

## 2019-10-13 NOTE — Patient Instructions (Signed)
Access Code: [email or web address]  URL: [email or web address]  Date: 10/13/2019  Prepared by: Margretta Ditty   Exercises Supine Shoulder Flexion Extension AAROM with Dowel - 10 reps - 1 sets - 2x daily - 7x weekly Supine Shoulder Horizontal Abduction Adduction AAROM with Dowel - 10 reps - 1 sets - 2x daily - 7x weekly Supine Shoulder External Rotation AAROM with Dowel - 10 reps - 1 sets - 2x daily - 7x weekly Supine Elbow Extension Stretch with Weight - 1 reps - 1 sets - 2-3 minutes hold - 2x daily - 7x weekly Doorway Pec Stretch at 60 Degrees Abduction with Arm Straight - 3 reps - 1 sets - 20 seconds hold - 2x daily - 7x weekly

## 2019-10-17 ENCOUNTER — Ambulatory Visit (INDEPENDENT_AMBULATORY_CARE_PROVIDER_SITE_OTHER): Payer: Medicare Other | Admitting: Physical Therapy

## 2019-10-17 ENCOUNTER — Other Ambulatory Visit: Payer: Self-pay

## 2019-10-17 ENCOUNTER — Encounter: Payer: Self-pay | Admitting: Physical Therapy

## 2019-10-17 DIAGNOSIS — M25511 Pain in right shoulder: Secondary | ICD-10-CM

## 2019-10-17 DIAGNOSIS — M6281 Muscle weakness (generalized): Secondary | ICD-10-CM

## 2019-10-17 DIAGNOSIS — R29898 Other symptoms and signs involving the musculoskeletal system: Secondary | ICD-10-CM

## 2019-10-17 NOTE — Therapy (Signed)
[facility] [place] [address] [place], [place], [address] Phone: [phone]   Fax:  [phone]  Physical Therapy Treatment  Patient Details  Name: Danielle Parsons MRN: [id number] Date of Birth: [birth year] Referring Provider (PT): Monica Becton, MD   Encounter Date: 10/17/2019  PT End of Session - 10/17/19 0932    Visit Number  2    Number of Visits  12    Date for PT Re-Evaluation  11/27/19    Authorization Type  medicare    PT Start Time  0847    PT Stop Time  0926    PT Time Calculation (min)  39 min    Activity Tolerance  Patient tolerated treatment well;No increased pain    Behavior During Therapy  WFL for tasks assessed/performed       Past Medical History:  Diagnosis Date  . H/O bladder infections     Past Surgical History:  Procedure Laterality Date  . CESAREAN SECTION  1979    There were no vitals filed for this visit.  Subjective Assessment - 10/17/19 0852    Subjective  Pt reports her Rt shoulder/elbow are feeling a little better. she is performing HEP 3x/day. She's ready to be back to her old self.    Patient Stated Goals  reduce pain and "be the way I was before I hit the concrete."    Currently in Pain?  Yes    Pain Score  2    up to 5/10 if she rubs her elbow.   Pain Location  Arm    Pain Orientation  Right    Pain Descriptors / Indicators  Aching    Aggravating Factors   rubbing arm; reaching in back seat to pick up purse    Pain Relieving Factors  rest, medication         [facility] PT Assessment - 10/17/19 0001      Assessment   Medical Diagnosis  M25.511,G89.29 (ICD-10-CM) - Chronic right shoulder pain    Referring Provider (PT)  Monica Becton, MD    Onset Date/Surgical Date  --   06/2019   Hand Dominance  Left    Prior Therapy  none      AROM   Right Shoulder Extension  40 Degrees    Right Shoulder Flexion  130 Degrees   with pain   Right Shoulder ABduction  130  Degrees   scaption with pain      [facility] Adult PT Treatment/Exercise - 10/17/19 0001      Shoulder Exercises: Supine   Horizontal ABduction  AAROM;Both;10 reps    External Rotation  --    Flexion  AAROM;Both;10 reps    Flexion Limitations  cues to slow speed of exercise; limited tolerance    Other Supine Exercises  scap squeeze x 5 sec x 5 reps, cues to not hold breath.       Shoulder Exercises: Seated   Other Seated Exercises  W's x 3 sec hold x 10 reps       Shoulder Exercises: Standing   Internal Rotation  AAROM;Both;10 reps   cane behind back   Extension  Both;10 reps;AAROM   cane behind back     Shoulder Exercises: Stretch   Star Gazer Stretch  3 reps;20 seconds   back of palms to forehead   Other Shoulder Stretches  supine Rt shoulder abdct with elbow ext (arm off of table) x 20 sec x 2 reps  Manual Therapy   Manual Therapy  Soft tissue mobilization;Taping    Soft tissue mobilization  STM to Rt pec and bicep; IASTM to Rt bicep, deltoid, and posterior scapular musculature to decrease fascial restrictions     Kinesiotex  Facilitate Muscle;Create Space      Kinesiotix   Create Space  I strip of sensitive skin tape applied with no stretch over scar on Rt elbow to desensitize area.     Facilitate Muscle   I strip of sensitive skin tape applied on ant/ post Rt deltoid with 15% stretch                   PT Long Term Goals - 10/13/19 1101      PT LONG TERM GOAL #1   Title  The patient will be indep with HEP.    Time  6    Period  Weeks    Target Date  11/27/19      PT LONG TERM GOAL #2   Title  The patient will reduce limitation per FOTO from 23% limitation up to 18% limitation.    Time  6    Period  Weeks    Target Date  11/27/19      PT LONG TERM GOAL #3   Title  The patient will  improve R elbow extension to -10 degreees extension in sitting.    Time  6    Period  Weeks    Target Date  11/27/19      PT LONG TERM GOAL #4   Title  The patient  will improve R shoulder strenth to 4+/5 to demo improved functional use.    Time  6    Period  Weeks    Target Date  11/27/19      PT LONG TERM GOAL #5   Title  The patient will move R shoulder to end range flexion without pain.    Time  6    Period  Weeks    Target Date  11/27/19            Plan - 10/17/19 0847    Clinical Impression Statement  Pt demonstrated improvement in Rt shoulder ROM, however continues to be painful and slightly guarded. Pt remains motivated to progress to reach her goals.  Modified HEP to tolerance.  Trial of IASTM and rock tape applied to Rt deltoid for decompression and increased proprioception. Goals are ongoing.    Rehab Potential  Good    PT Frequency  2x / week    PT Duration  6 weeks    PT Treatment/Interventions  ADLs/Self Care Home Management;Therapeutic activities;Therapeutic exercise;Neuromuscular re-education;Electrical Stimulation;Cryotherapy;Iontophoresis 4mg /ml Dexamethasone;Moist Heat;Ultrasound;Patient/family education;Taping;Dry needling;Passive range of motion;Manual techniques    PT Next Visit Plan  progress HEP focusing on R shoulder isometrics or t-band to tolerance, MMT shoulder, as tolerated.    PT Home Exercise Plan  Access Code: [email or web address]    Consulted and Agree with Plan of Care  Patient       Patient will benefit from skilled therapeutic intervention in order to improve the following deficits and impairments:  Pain, Postural dysfunction, Decreased strength, Decreased range of motion  Visit Diagnosis: Acute pain of right shoulder  Muscle weakness (generalized)  Other symptoms and signs involving the musculoskeletal system     Problem List Patient Active Problem List   Diagnosis Date Noted  . Right elbow pain 09/27/2019  . Right shoulder pain 09/27/2019  . 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  Atrophic vaginitis 01/05/2018  . Female stress incontinence 01/05/2018  . GAD (generalized anxiety disorder) 02/18/2017  . Osteopenia  of multiple sites 02/18/2017  . Cystocele with rectocele 11/16/2016   Kerin Perna, PTA 10/17/19 1:41 PM  [facility] [place], [place], [address] Phone: [phone]   Fax:  [phone]  Name: Danielle Parsons MRN: [id number] Date of Birth: 06/19/1946

## 2019-10-17 NOTE — Patient Instructions (Addendum)
Kinesiology tape What is kinesiology tape?  There are many brands of kinesiology tape.  KTape, Rock [employer], [employer], Dynamic tape, to name a few. It is an elasticized tape designed to support the body's natural healing process. This tape provides stability and support to muscles and joints without restricting motion. It can also help decrease swelling in the area of application. How does it work? The tape microscopically lifts and decompresses the skin to allow for drainage of lymph (swelling) to flow away from area, reducing inflammation.  The tape has the ability to help re-educate the neuromuscular system by targeting specific receptors in the skin.  The presence of the tape increases the body's awareness of posture and body mechanics.  Do not use with: . Open wounds . Skin lesions . Adhesive allergies Safe removal of the tape: In some rare cases, mild/moderate skin irritation can occur.  This can include redness, itchiness, or hives. If this occurs, immediately remove tape and consult your primary care physician if symptoms are severe or do not resolve within 2 days.  To remove tape safely, hold nearby skin with one hand and gentle roll tape down with other hand.  You can apply oil or conditioner to tape while in shower prior to removal to loosen adhesive.  DO NOT swiftly rip tape off like a band-aid, as this could cause skin tears and additional skin irritation.   Access Code: [email or web address]  URL: [email or web address]  Date: 10/17/2019  Prepared by: Mayer Camel   Exercises  Supine Shoulder Flexion Extension AAROM with Dowel - 10 reps - 1 sets - 2x daily - 7x weekly  Supine Shoulder Horizontal Abduction Adduction AAROM with Dowel - 10 reps - 1 sets - 2x daily - 7x weekly  Hooklying Shoulder T - 10 reps - 3 sets - 1x daily - 7x weekly  Supine Chest Stretch with Elbows Bent - 3 reps - 1 sets - 20 seconds hold - 1x daily - 7x weekly  Supine Scapular Retraction - 5 reps -  1 sets - 5 hold - 1x daily - 7x weekly  Seated Shoulder W - 10 reps - 1 sets - 3 seconds hold - 1x daily - 7x weekly  Standing Shoulder Extension with Dowel - 10 reps - 1 sets - 3 seconds hold - 1x daily - 7x weekly

## 2019-10-19 ENCOUNTER — Ambulatory Visit (INDEPENDENT_AMBULATORY_CARE_PROVIDER_SITE_OTHER): Payer: Medicare Other | Admitting: Physical Therapy

## 2019-10-19 ENCOUNTER — Other Ambulatory Visit: Payer: Self-pay

## 2019-10-19 DIAGNOSIS — M25511 Pain in right shoulder: Secondary | ICD-10-CM | POA: Diagnosis present

## 2019-10-19 DIAGNOSIS — M6281 Muscle weakness (generalized): Secondary | ICD-10-CM

## 2019-10-19 DIAGNOSIS — R29898 Other symptoms and signs involving the musculoskeletal system: Secondary | ICD-10-CM | POA: Diagnosis not present

## 2019-10-19 NOTE — Therapy (Signed)
[facility] [place] [address] [place], [place], [address] Phone: [phone]   Fax:  [phone]  Physical Therapy Treatment  Patient Details  Name: Danielle Parsons MRN: [id number] Date of Birth: 1946/08/31 Referring Provider (PT): Monica Becton, MD   Encounter Date: 10/19/2019  PT End of Session - 10/19/19 0912    Visit Number  3    Number of Visits  12    Date for PT Re-Evaluation  11/27/19    Authorization Type  medicare    PT Start Time  0845    PT Stop Time  0930    PT Time Calculation (min)  45 min    Activity Tolerance  Patient tolerated treatment well;No increased pain    Behavior During Therapy  WFL for tasks assessed/performed       Past Medical History:  Diagnosis Date  . H/O bladder infections     Past Surgical History:  Procedure Laterality Date  . CESAREAN SECTION  1979    There were no vitals filed for this visit.  Subjective Assessment - 10/19/19 0906    Subjective  Pt reports improvement in Rt shoulder mobility, however pain remains the same.  She couldn't notice any difference with Rock tape on shoulder.  She demonstrates how she can raise her arm overhead, but it is painful upon return.    Patient Stated Goals  reduce pain and "be the way I was before I hit the concrete."    Currently in Pain?  Yes    Pain Score  4     Pain Location  Arm    Pain Orientation  Right    Pain Descriptors / Indicators  Aching    Aggravating Factors   raising arm overhead         [facility] PT Assessment - 10/19/19 0001      Assessment   Medical Diagnosis  M25.511,G89.29 (ICD-10-CM) - Chronic right shoulder pain    Referring Provider (PT)  Monica Becton, MD    Onset Date/Surgical Date  --   06/2019   Hand Dominance  Left    Prior Therapy  none       [facility] Adult PT Treatment/Exercise - 10/19/19 0001      Shoulder Exercises: Supine   Horizontal ABduction  Both;10 reps;Strengthening    Theraband  Level (Shoulder Horizontal ABduction)  Level 2 (Red)    Diagonals  Strengthening;Right;10 reps    Theraband Level (Shoulder Diagonals)  Level 1 (Yellow);Level 2 (Red)   1 set with each, limited range to tolerance     Shoulder Exercises: Sidelying   External Rotation  Strengthening;Right;5 reps;10 reps    External Rotation Weight (lbs)  1,2   5 x 1#, 10 x 2#     Shoulder Exercises: Standing   Flexion  Right;10 reps;Strengthening    Shoulder Flexion Weight (lbs)  1   to 80-90 deg   Flexion Limitations  flexion AROM (RUE) to tolerance x 5 reps    ABduction  Right;10 reps;Strengthening   to 80-90 deg, mirror for posture cues   Shoulder ABduction Weight (lbs)  1    Extension  Strengthening;Both;10 reps    Theraband Level (Shoulder Extension)  Level 1 (Yellow)    Row  Both;Strengthening;12 reps    Theraband Level (Shoulder Row)  Level 2 (Red)      Shoulder Exercises: Stretch   Corner Stretch  2 reps;20 seconds    Corner Stretch Limitations  unable to tolerate doorway stretch,  improved with corner stretch      Modalities   Modalities  Moist Heat      Moist Heat Therapy   Number Minutes Moist Heat  10 Minutes    Moist Heat Location  Shoulder   Rt     Manual Therapy   Soft tissue mobilization  IASTM and STM to Rt forearm, bicep, deltoid, and posterior scapular musculature to decrease fascial restrictions       Kinesiotix   Create Space  tape removed              PT Education - 10/19/19 0928    Education Details  updated HEP    Person(s) Educated  Patient    Methods  Explanation;Handout;Verbal cues;Tactile cues;Demonstration    Comprehension  Verbalized understanding;Returned demonstration          PT Long Term Goals - 10/13/19 1101      PT LONG TERM GOAL #1   Title  The patient will be indep with HEP.    Time  6    Period  Weeks    Target Date  11/27/19      PT LONG TERM GOAL #2   Title  The patient will reduce limitation per FOTO from 23% limitation up to  18% limitation.    Time  6    Period  Weeks    Target Date  11/27/19      PT LONG TERM GOAL #3   Title  The patient will  improve R elbow extension to -10 degreees extension in sitting.    Time  6    Period  Weeks    Target Date  11/27/19      PT LONG TERM GOAL #4   Title  The patient will improve R shoulder strenth to 4+/5 to demo improved functional use.    Time  6    Period  Weeks    Target Date  11/27/19      PT LONG TERM GOAL #5   Title  The patient will move R shoulder to end range flexion without pain.    Time  6    Period  Weeks    Target Date  11/27/19            Plan - 10/19/19 2353    Clinical Impression Statement  Pt demonstrated improved Rt shoulder flexion ROM, but has pain in ant shoulder upon return to neutral. Pt had limited tolerance for light resistance with shoulder flex/abdct to 80 deg, but tolerated all other resistance exercises without increase in pain.  Pt reported reduction of pain in Rt shoulder at end of session. Progressing towards goals.    Rehab Potential  Good    PT Frequency  2x / week    PT Duration  6 weeks    PT Treatment/Interventions  ADLs/Self Care Home Management;Therapeutic activities;Therapeutic exercise;Neuromuscular re-education;Electrical Stimulation;Cryotherapy;Iontophoresis 4mg /ml Dexamethasone;Moist Heat;Ultrasound;Patient/family education;Taping;Dry needling;Passive range of motion;Manual techniques    PT Next Visit Plan  assess response to new HEP    PT Home Exercise Plan  Access Code: [email or web address]    Consulted and Agree with Plan of Care  Patient       Patient will benefit from skilled therapeutic intervention in order to improve the following deficits and impairments:  Pain, Postural dysfunction, Decreased strength, Decreased range of motion  Visit Diagnosis: Acute pain of right shoulder  Muscle weakness (generalized)  Other symptoms and signs involving the musculoskeletal system     Problem List Patient Active  Problem List   Diagnosis Date Noted  . Right elbow pain 09/27/2019  . Right shoulder pain 09/27/2019  . 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 Atrophic vaginitis 01/05/2018  . Female stress incontinence 01/05/2018  . GAD (generalized anxiety disorder) 02/18/2017  . Osteopenia of multiple sites 02/18/2017  . Cystocele with rectocele 11/16/2016   Mayer Camel, PTA 10/19/19 1:11 PM   [facility] [place] [address] [place], [place], [address] Phone: [phone]   Fax:  [phone]  Name: Danielle Parsons MRN: [id number] Date of Birth: [birth year]

## 2019-10-19 NOTE — Patient Instructions (Signed)
Access Code: [email or web address]  URL: [email or web address]  Date: 10/19/2019  Prepared by: Mayer Camel   Exercises  Supine Shoulder Flexion Extension AAROM with Dowel - 10 reps - 1 sets - 2x daily - 7x weekly  Supine Chest Stretch with Elbows Bent - 3 reps - 1 sets - 20 seconds hold - 1x daily - 7x weekly  Seated Shoulder W - 10 reps - 1 sets - 3 seconds hold - 1x daily - 7x weekly  Supine PNF D2 Flexion with Resistance - 10 reps - 1 sets - 1x daily - 7x weekly  Supine Shoulder Horizontal Abduction with Resistance - 10 reps - 1 sets - 1x daily - 7x weekly  Standing Shoulder External Rotation with Resistance - 10 reps - 1 sets - 1x daily - 7x weekly  Standing Row with Anchored Resistance - 10 reps - 1 sets - 1x daily - 7x weekly  Corner Pec Major Stretch - 2-3 reps - 1 sets - 15 seconds hold - 1x daily - 7x weekly  Standing Shoulder Extension with Dowel - 10 reps - 1 sets - 3 seconds hold - 1x daily - 7x weekly

## 2019-10-23 ENCOUNTER — Other Ambulatory Visit: Payer: Self-pay

## 2019-10-23 ENCOUNTER — Ambulatory Visit (INDEPENDENT_AMBULATORY_CARE_PROVIDER_SITE_OTHER): Payer: Medicare Other | Admitting: Rehabilitative and Restorative Service Providers"

## 2019-10-23 ENCOUNTER — Encounter: Payer: Self-pay | Admitting: Rehabilitative and Restorative Service Providers"

## 2019-10-23 DIAGNOSIS — M25511 Pain in right shoulder: Secondary | ICD-10-CM | POA: Diagnosis present

## 2019-10-23 DIAGNOSIS — R29898 Other symptoms and signs involving the musculoskeletal system: Secondary | ICD-10-CM

## 2019-10-23 DIAGNOSIS — M6281 Muscle weakness (generalized): Secondary | ICD-10-CM

## 2019-10-23 NOTE — Patient Instructions (Signed)
IONTOPHORESIS PATIENT PRECAUTIONS & CONTRAINDICATIONS   Redness under one or both electrodes can occur.  This is characterized by a uniform redness that usually disappears within 12 hours of treatment.  Small pinhead size blisters may result in response to the drug.  Contact your physician if the problem persists more than 24 hours.  On rare occasions, iontophoresis therapy can result in temporary skin reactions such as rash, inflammation, irritation or burns.  The skin reaction bay be the result of individual sensitivity to the ionic solution used, the condition of the skin at the start of treatment, reaction to the materials in the electrodes, allergies or sensitivity to dexamethasone, or a poor connection between the patch and your skin.  Discontinue using iontophoresis if you have any of these reactions and report to your therapist.  Remove the Patch or electrodes if you have any undue sensation of pain or burning during the treatment and report discomfort to your therapist.  Tell your Therapist if you have had known adverse reactions to the application of electrical current.  If using the Patch, the LED light will turn off when treatment is complete and the patch can be removed.  Approximate treatment time is 1-3 hours.  Remove the patch when light goes off or after 6 hours.  The Patch can be worn during normal activity, however excessive motion where the electrodes have been placed can cause poor contact between the skin and the electrode or uneven electrical current resulting in greater risk of skin irritation.  Keep out of the reach of children.   DO NOT use if you have a cardiac pacemaker or any other electrically sensitive  Implanted device.  DO NOT use if you have known sensitivity to Dexamethasone.  DO NOT use during Magnetic Resonance Imaging (MRI).  DO NOT use over broken or compromised skin (e.g. Sunburn, cuts or acne) due to the increased risk of skin reaction.  DO NOT  SHAVE over the area to be treated:  To establish good contact between the Patch and the skin excessive hair may be clipped.  DO NOT place the Patch or electrodes on or over your eyes, directly over your heart or brain.  DO NOT reuse the Patch or electrodes as this may cause burns to occur.  

## 2019-10-23 NOTE — Therapy (Signed)
[facility] [place] [address] [place], [place], [address] Phone: [phone]   Fax:  [phone]  Physical Therapy Treatment  Patient Details  Name: Danielle Parsons MRN: [id number] Date of Birth: 1946/06/06 Referring Provider (PT): Monica Becton, MD   Encounter Date: 10/23/2019  PT End of Session - 10/23/19 1344    Visit Number  4    Number of Visits  12    Date for PT Re-Evaluation  11/27/19    Authorization Type  medicare    PT Start Time  0845    PT Stop Time  0930    PT Time Calculation (min)  45 min    Activity Tolerance  Patient tolerated treatment well;No increased pain    Behavior During Therapy  WFL for tasks assessed/performed       Past Medical History:  Diagnosis Date  . H/O bladder infections     Past Surgical History:  Procedure Laterality Date  . CESAREAN SECTION  1979    There were no vitals filed for this visit.  Subjective Assessment - 10/23/19 0847    Subjective  The patient reports shoulder is doing much better.    Pertinent History  none    Patient Stated Goals  reduce pain and "be the way I was before I hit the concrete."    Currently in Pain?  No/denies    Aggravating Factors   specific movements    Pain Relieving Factors  rest, medication                       [facility] Adult PT Treatment/Exercise - 10/23/19 1344      Exercises   Exercises  Shoulder;Elbow      Shoulder Exercises: Supine   Protraction  Strengthening;Right;10 reps    Protraction Weight (lbs)  1    External Rotation  AROM;Right;10 reps      Shoulder Exercises: Prone   Retraction  Strengthening;10 reps;Right    Retraction Limitations  pain with arm abducted to 90 degrees, able to tolerate at shoulder neurtral    Extension  Strengthening;Right;10 reps      Shoulder Exercises: Sidelying   ABduction  AROM;Right;10 reps    ABduction Limitations  no pain in this position.    Other Sidelying Exercises   diagonal scapular D1 mobility with facilitation and then with AROM    Other Sidelying Exercises  sidelying scapular retraction with tactile cues x 10 reps      Shoulder Exercises: Isometric Strengthening   Flexion  3X5"    Extension  3X5"    External Rotation  3X5"      Shoulder Exercises: Stretch   Corner Stretch  2 reps;20 seconds    Corner Stretch Limitations  performed doorway stretch     Other Shoulder Stretches  standing wall neural glide pressing palm into wall and rotation to the left straightening the elbow.        Modalities   Modalities  Iontophoresis      Iontophoresis   Type of Iontophoresis  Dexamethasone    Location  R anterior shoulder    Dose  1 mL    Time  6 hour      Manual Therapy   Manual Therapy  Soft tissue mobilization    Manual therapy comments  seated with R elbow resting on elevated mat table    Soft tissue mobilization  IASTM and STM R pectoralis and biceps tendon from shoulder to elbow  PT Education - 10/23/19 1343    Education Details  removed 2 theraband activities (supine diagonals and standing retraction with shoulders at 90) due to pain x 1 hour after ther ex at home    Person(s) Educated  Patient    Methods  Explanation;Demonstration;Handout    Comprehension  Verbalized understanding;Returned demonstration          PT Long Term Goals - 10/13/19 1101      PT LONG TERM GOAL #1   Title  The patient will be indep with HEP.    Time  6    Period  Weeks    Target Date  11/27/19      PT LONG TERM GOAL #2   Title  The patient will reduce limitation per FOTO from 23% limitation up to 18% limitation.    Time  6    Period  Weeks    Target Date  11/27/19      PT LONG TERM GOAL #3   Title  The patient will  improve R elbow extension to -10 degreees extension in sitting.    Time  6    Period  Weeks    Target Date  11/27/19      PT LONG TERM GOAL #4   Title  The patient will improve R shoulder strenth to 4+/5 to demo  improved functional use.    Time  6    Period  Weeks    Target Date  11/27/19      PT LONG TERM GOAL #5   Title  The patient will move R shoulder to end range flexion without pain.    Time  6    Period  Weeks    Target Date  11/27/19            Plan - 10/23/19 1347    Clinical Impression Statement  The patient was reporting some increase in pain R anterior shoulder with resistance bands (except supine retraction- she tolerated band on that exercise).  PT modified today.  She has palpable tightness in R biceps and R pectoralis improved with STM and IASTM. The patient is noting reduced pain.  PT continuing to progress to patient tolrance.  We added iontophoresis today to reduce inflammation anterior shoulder.    Rehab Potential  Good    PT Frequency  2x / week    PT Duration  6 weeks    PT Treatment/Interventions  ADLs/Self Care Home Management;Therapeutic activities;Therapeutic exercise;Neuromuscular re-education;Electrical Stimulation;Cryotherapy;Iontophoresis 4mg /ml Dexamethasone;Moist Heat;Ultrasound;Patient/family education;Taping;Dry needling;Passive range of motion;Manual techniques    PT Next Visit Plan  assess response to new HEP    PT Home Exercise Plan  Access Code: [email or web address]    Consulted and Agree with Plan of Care  Patient       Patient will benefit from skilled therapeutic intervention in order to improve the following deficits and impairments:  Pain, Postural dysfunction, Decreased strength, Decreased range of motion  Visit Diagnosis: Acute pain of right shoulder  Muscle weakness (generalized)  Other symptoms and signs involving the musculoskeletal system     Problem List Patient Active Problem List   Diagnosis Date Noted  . Right elbow pain 09/27/2019  . Right shoulder pain 09/27/2019  . 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 Atrophic vaginitis 01/05/2018  . Female stress incontinence 01/05/2018  . GAD (generalized anxiety disorder) 02/18/2017  . Osteopenia of  multiple sites 02/18/2017  . Cystocele with rectocele 11/16/2016    Estle Sabella, PT 10/23/2019, 1:48 PM  [place]  [facility] [place] [address] [place], [place], [address] Phone: [phone]   Fax:  [phone]  Name: Lysha Schrade MRN: [id number] Date of Birth: 07/28/1946

## 2019-10-24 ENCOUNTER — Ambulatory Visit: Payer: Medicare Other | Attending: Internal Medicine

## 2019-10-24 DIAGNOSIS — Z23 Encounter for immunization: Secondary | ICD-10-CM | POA: Insufficient documentation

## 2019-10-24 NOTE — Progress Notes (Signed)
   Covid-19 Vaccination Clinic  Name:  Danielle Parsons    MRN: [id number] DOB: 1946/06/12  10/24/2019  Danielle Parsons was observed post Covid-19 immunization for 15 minutes without incidence. She was provided with Vaccine Information Sheet and instruction to access the V-Safe system.   Danielle Parsons was instructed to call 911 with any severe reactions post vaccine: Marland Kitchen Difficulty breathing  . Swelling of your face and throat  . A fast heartbeat  . A bad rash all over your body  . Dizziness and weakness    Immunizations Administered    Name Date Dose VIS Date Route   Pfizer COVID-19 Vaccine 10/24/2019  8:34 AM 0.3 mL 08/25/2019 Intramuscular   Manufacturer: [employer], [employer]   Lot: [id number]   NDC: [address]-6

## 2019-10-26 ENCOUNTER — Other Ambulatory Visit: Payer: Self-pay

## 2019-10-26 ENCOUNTER — Ambulatory Visit (INDEPENDENT_AMBULATORY_CARE_PROVIDER_SITE_OTHER): Payer: Medicare Other | Admitting: Physical Therapy

## 2019-10-26 ENCOUNTER — Encounter: Payer: Self-pay | Admitting: Physical Therapy

## 2019-10-26 DIAGNOSIS — R29898 Other symptoms and signs involving the musculoskeletal system: Secondary | ICD-10-CM

## 2019-10-26 DIAGNOSIS — M6281 Muscle weakness (generalized): Secondary | ICD-10-CM | POA: Diagnosis not present

## 2019-10-26 DIAGNOSIS — M25511 Pain in right shoulder: Secondary | ICD-10-CM

## 2019-10-26 NOTE — Patient Instructions (Signed)
Access Code: [email or web address]  URL: [email or web address]  Date: 10/26/2019  Prepared by: Mayer Camel   Exercises  Supine Shoulder Flexion Extension AAROM with Dowel - 10 reps - 1 sets - 2x daily - 7x weekly  Supine Chest Stretch with Elbows Bent - 3 reps - 1 sets - 20 seconds hold - 1x daily - 7x weekly  Seated Shoulder W - 10 reps - 1 sets - 3 seconds hold - 1x daily - 7x weekly  Supine Shoulder Horizontal Abduction with Resistance - 10 reps - 1 sets - 1x daily - 7x weekly  Standing Row with Anchored Resistance - 10 reps - 1 sets - 1x daily - 7x weekly  Corner Pec Major Stretch - 2-3 reps - 1 sets - 15 seconds hold - 1x daily - 7x weekly  Standing Shoulder Extension with Dowel - 10 reps - 1 sets - 3 seconds hold - 1x daily - 7x weekly  Sidelying Thoracic Rotation with Open Book - 5-10 reps - 1 sets - 5 seconds hold - 1x daily - 7x weekly

## 2019-10-26 NOTE — Therapy (Signed)
[facility] [place] [address] [place], [place], [address] Phone: [phone]   Fax:  [phone]  Physical Therapy Treatment  Patient Details  Name: Danielle Parsons MRN: [id number] Date of Birth: 12-23-1945 Referring Provider (PT): Monica Becton, MD   Encounter Date: 10/26/2019  PT End of Session - 10/26/19 0849    Visit Number  5    Number of Visits  12    Date for PT Re-Evaluation  11/27/19    Authorization Type  medicare    PT Start Time  0846    PT Stop Time  0935   MHP last 10 min   PT Time Calculation (min)  49 min    Activity Tolerance  Patient tolerated treatment well    Behavior During Therapy  [facility] for tasks assessed/performed       Past Medical History:  Diagnosis Date  . H/O bladder infections     Past Surgical History:  Procedure Laterality Date  . CESAREAN SECTION  1979    There were no vitals filed for this visit.  Subjective Assessment - 10/26/19 0850    Subjective  Pt reports her Rt forearm began to hurt 2 days ago.  She's unsure what caused it.  she has not done the band exercises this week. She believes the ionto patch may have helped her Rt shoulder.    Pertinent History  none    Patient Stated Goals  reduce pain and "be the way I was before I hit the concrete."    Currently in Pain?  Yes    Pain Score  3     Pain Location  Arm    Pain Orientation  Right;Upper;Lower    Pain Descriptors / Indicators  Dull    Aggravating Factors   chores, reaching back    Pain Relieving Factors  rest         [facility] PT Assessment - 10/26/19 0001      Assessment   Medical Diagnosis  M25.511,G89.29 (ICD-10-CM) - Chronic right shoulder pain    Referring Provider (PT)  Monica Becton, MD    Onset Date/Surgical Date  --   06/2019   Hand Dominance  Left    Prior Therapy  none      ROM / Strength   AROM / PROM / Strength  PROM;AROM      AROM   Right Shoulder Extension  43 Degrees    Right  Shoulder Flexion  134 Degrees    Left Shoulder Extension  62 Degrees      PROM   PROM Assessment Site  Shoulder    Right/Left Shoulder  Right    Right Shoulder Flexion  147 Degrees        [facility] Adult PT Treatment/Exercise - 10/26/19 0001      Shoulder Exercises: Sidelying   External Rotation  Right;10 reps    External Rotation Weight (lbs)  1    ABduction  AROM;Right;10 reps    Other Sidelying Exercises  thoracic open book Rt, x 10 reps (limited range, but tolerated). 5 reps L for comparison.       Shoulder Exercises: Standing   Flexion  Right;AROM;5 reps    ABduction  AROM;Right;5 reps    Extension  AAROM;Both;15 reps   dowel   Row  Both;10 reps;Strengthening    Theraband Level (Shoulder Row)  Level 2 (Red)      Shoulder Exercises: ROM/Strengthening   Nustep  L4: arms/legs x 4.5 min  Modalities   Modalities  Iontophoresis      Moist Heat Therapy   Number Minutes Moist Heat  10 Minutes    Moist Heat Location  Shoulder   Rt      Iontophoresis   Type of Iontophoresis  Dexamethasone    Location  Rt bicep tendon, ant shoulder    Dose  1 cc     Time  80 mA stat patch, 6 hr wear time.       Manual Therapy   Manual therapy comments  pt supine     Soft tissue mobilization  IASTM and STM R ant shoulder, bicep, mid-prox L wrist extensors, pectoralis to decrease fascial tightness and improve ROM             PT Education - 10/26/19 0929    Education Details  HEP- added open book, supine    Person(s) Educated  Patient    Methods  Explanation;Handout    Comprehension  Returned demonstration;Verbalized understanding          PT Long Term Goals - 10/13/19 1101      PT LONG TERM GOAL #1   Title  The patient will be indep with HEP.    Time  6    Period  Weeks    Target Date  11/27/19      PT LONG TERM GOAL #2   Title  The patient will reduce limitation per FOTO from 23% limitation up to 18% limitation.    Time  6    Period  Weeks    Target Date   11/27/19      PT LONG TERM GOAL #3   Title  The patient will  improve R elbow extension to -10 degreees extension in sitting.    Time  6    Period  Weeks    Target Date  11/27/19      PT LONG TERM GOAL #4   Title  The patient will improve R shoulder strenth to 4+/5 to demo improved functional use.    Time  6    Period  Weeks    Target Date  11/27/19      PT LONG TERM GOAL #5   Title  The patient will move R shoulder to end range flexion without pain.    Time  6    Period  Weeks    Target Date  11/27/19            Plan - 10/26/19 0925    Clinical Impression Statement  Pt demonstrating gradual improvement in Rt shoulder ROM; continues to be limited in horiz abdct, ext, abdct, and end range flexion.  Palpable tightness in Rt ant shoulder, pec, wrist flexors at lateral epicondyle; improved with STM. Pt making gradual progress towards goals..    Rehab Potential  Good    PT Frequency  2x / week    PT Duration  6 weeks    PT Treatment/Interventions  ADLs/Self Care Home Management;Therapeutic activities;Therapeutic exercise;Neuromuscular re-education;Electrical Stimulation;Cryotherapy;Iontophoresis 4mg /ml Dexamethasone;Moist Heat;Ultrasound;Patient/family education;Taping;Dry needling;Passive range of motion;Manual techniques    PT Next Visit Plan  continue Rt shoulder ROM, manual therapy.  modalities as indicated.    PT Home Exercise Plan  Access Code: [email or web address]    [email or web address] and Agree with Plan of Care  Patient       Patient will benefit from skilled therapeutic intervention in order to improve the following deficits and impairments:  Pain, Postural dysfunction, Decreased strength, Decreased range of motion  Visit  Diagnosis: Acute pain of right shoulder  Muscle weakness (generalized)  Other symptoms and signs involving the musculoskeletal system     Problem List Patient Active Problem List   Diagnosis Date Noted  . Right elbow pain 09/27/2019  . Right shoulder pain  09/27/2019  . 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 Atrophic vaginitis 01/05/2018  . Female stress incontinence 01/05/2018  . GAD (generalized anxiety disorder) 02/18/2017  . Osteopenia of multiple sites 02/18/2017  . Cystocele with rectocele 11/16/2016   Mayer Camel, PTA 10/26/19 2:56 PM  [facility] [place] [address] [place], [place], [address] Phone: [phone]   Fax:  [phone]  Name: Lexiana Spindel MRN: [id number] Date of Birth: 1946-02-26

## 2019-10-30 ENCOUNTER — Other Ambulatory Visit: Payer: Self-pay

## 2019-10-30 ENCOUNTER — Ambulatory Visit (INDEPENDENT_AMBULATORY_CARE_PROVIDER_SITE_OTHER): Payer: Medicare Other | Admitting: Physical Therapy

## 2019-10-30 DIAGNOSIS — R29898 Other symptoms and signs involving the musculoskeletal system: Secondary | ICD-10-CM | POA: Diagnosis not present

## 2019-10-30 DIAGNOSIS — M6281 Muscle weakness (generalized): Secondary | ICD-10-CM | POA: Diagnosis not present

## 2019-10-30 DIAGNOSIS — M25511 Pain in right shoulder: Secondary | ICD-10-CM | POA: Diagnosis present

## 2019-10-30 NOTE — Therapy (Signed)
[place], [place], [address] Phone: [phone]   Fax:  [phone]  Physical Therapy Treatment  Patient Details  Name: Danielle Parsons MRN: [id number] Date of Birth: July 13, 1946 Referring Provider (PT): Silverio Decamp, MD   Encounter Date: 10/30/2019  PT End of Session - 10/30/19 1101    Visit Number  6    Number of Visits  12    Date for PT Re-Evaluation  11/27/19    Authorization Type  medicare    PT Start Time  1017    PT Stop Time  1058    PT Time Calculation (min)  41 min    Activity Tolerance  Patient tolerated treatment well    Behavior During Therapy  [facility] for tasks assessed/performed       Past Medical History:  Diagnosis Date  . H/O bladder infections     Past Surgical History:  Procedure Laterality Date  . CESAREAN SECTION  1979    There were no vitals filed for this visit.  Subjective Assessment - 10/30/19 1019    Subjective  Pt reports Friday she had a turning point.  Not much pain in Rt shoulder since then.  She is very excited and pleased with progress.    Patient Stated Goals  reduce pain and "be the way I was before I hit the concrete."    Currently in Pain?  No/denies    Pain Score  0-No pain         [facility] PT Assessment - 10/30/19 0001      Assessment   Medical Diagnosis  M25.511,G89.29 (ICD-10-CM) - Chronic right shoulder pain    Referring Provider (PT)  Silverio Decamp, MD    Onset Date/Surgical Date  --   06/2019   Hand Dominance  Left    Next MD Visit  11/08/19    Prior Therapy  none      AROM   Right Shoulder Extension  47 Degrees    Right Shoulder Flexion  149 Degrees    Right Shoulder ABduction  140 Degrees    Right Shoulder Internal Rotation  --   palm to Rt lateral buttocks    Right Shoulder External Rotation  74 Degrees   standing, arm abdct ~80 deg   Right Shoulder Horizontal ABduction  30 Degrees    Left Shoulder Flexion  163  Degrees      Strength   Right Shoulder Flexion  4/5   with pain   Right Shoulder Extension  5/5    Right Shoulder ABduction  4/5   with pain    Right Shoulder Internal Rotation  5/5    Right Shoulder External Rotation  5/5       [facility] Adult PT Treatment/Exercise - 10/30/19 0001      Shoulder Exercises: Supine   Horizontal ABduction  Both;10 reps;Strengthening    Theraband Level (Shoulder Horizontal ABduction)  Level 2 (Red)    Flexion  AROM;Both;10 reps      Shoulder Exercises: Sidelying   Other Sidelying Exercises  thoracic open book Rt, x 5 reps, repeated with red band, 10 with red band on LUE.       Shoulder Exercises: Standing   Flexion  Right;AROM;5 reps    ABduction  AROM;Right;5 reps    Extension  AAROM;Both;10 reps   dowel     Shoulder Exercises: ROM/Strengthening   Nustep  L4-5: arms/legs x 5 min       Shoulder  Exercises: IT sales professional  20 seconds;3 reps    Internal Rotation Stretch  4 reps   strap assist, 10-15 sec hold   Star Gazer Stretch  1 rep;20 seconds      Iontophoresis   Type of Iontophoresis  --   held     Manual Therapy   Soft tissue mobilization  IASTM and STM Rt ant/post lateral shoulder, bicep, mid-prox R wrist extensors, pectoralis to decrease fascial tightness and improve ROM                  PT Long Term Goals - 10/30/19 1228      PT LONG TERM GOAL #1   Title  The patient will be indep with HEP.    Time  6    Period  Weeks    Status  On-going      PT LONG TERM GOAL #2   Title  The patient will reduce limitation per FOTO from 23% limitation up to 18% limitation.    Time  6    Period  Weeks    Status  On-going      PT LONG TERM GOAL #3   Title  The patient will  improve R elbow extension to -10 degreees extension in sitting.    Time  6    Period  Weeks    Status  On-going      PT LONG TERM GOAL #4   Title  The patient will improve R shoulder strenth to 4+/5 to demo improved functional use.    Time  6     Period  Weeks    Status  Partially Met      PT LONG TERM GOAL #5   Title  The patient will move R shoulder to end range flexion without pain.    Time  6    Period  Weeks    Status  On-going            Plan - 10/30/19 1226    Clinical Impression Statement  Continued improvement in Rt shoulder and elbow extension ROM,  and Rt shoulder strength.  She is still limited and painful with resisted Rt shoulder flexion/ Abdct. Pt was able to tolerate thoracic rotation with horiz abdct much better today, without pain.  She is very limited with Rt shoulder IR; will benefit from continued stretching of this area. She has partially met LTG#4.    Rehab Potential  Good    PT Frequency  2x / week    PT Duration  6 weeks    PT Treatment/Interventions  ADLs/Self Care Home Management;Therapeutic activities;Therapeutic exercise;Neuromuscular re-education;Electrical Stimulation;Cryotherapy;Iontophoresis 21m/ml Dexamethasone;Moist Heat;Ultrasound;Patient/family education;Taping;Dry needling;Passive range of motion;Manual techniques    PT Next Visit Plan  continue Rt shoulder ROM and strengthening.  measure elbow ROM.  manual therapy and modalities as indicated.    PT Home Exercise Plan  Access Code: [email or web address]    [email or web address] Agree with Plan of Care  Patient       Patient will benefit from skilled therapeutic intervention in order to improve the following deficits and impairments:  Pain, Postural dysfunction, Decreased strength, Decreased range of motion  Visit Diagnosis: Acute pain of right shoulder  Muscle weakness (generalized)  Other symptoms and signs involving the musculoskeletal system     Problem List Patient Active Problem List   Diagnosis Date Noted  . Right elbow pain 09/27/2019  . Right shoulder pain 09/27/2019  . 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 Atrophic  vaginitis 01/05/2018  . Female stress incontinence 01/05/2018  . GAD (generalized anxiety disorder) 02/18/2017  . Osteopenia of  multiple sites 02/18/2017  . Cystocele with rectocele 11/16/2016   Kerin Perna, PTA 10/30/19 12:57 PM  [place], [place], [address] Phone: [phone]   Fax:  [phone]  Name: Danielle Parsons MRN: [id number] Date of Birth: [birth year]

## 2019-11-02 ENCOUNTER — Encounter: Payer: Medicare Other | Admitting: Physical Therapy

## 2019-11-06 ENCOUNTER — Ambulatory Visit (INDEPENDENT_AMBULATORY_CARE_PROVIDER_SITE_OTHER): Payer: Medicare Other | Admitting: Physical Therapy

## 2019-11-06 ENCOUNTER — Other Ambulatory Visit: Payer: Self-pay

## 2019-11-06 ENCOUNTER — Encounter: Payer: Self-pay | Admitting: Physical Therapy

## 2019-11-06 DIAGNOSIS — R29898 Other symptoms and signs involving the musculoskeletal system: Secondary | ICD-10-CM | POA: Diagnosis not present

## 2019-11-06 DIAGNOSIS — M25511 Pain in right shoulder: Secondary | ICD-10-CM | POA: Diagnosis present

## 2019-11-06 DIAGNOSIS — M6281 Muscle weakness (generalized): Secondary | ICD-10-CM

## 2019-11-06 NOTE — Therapy (Signed)
[place], [place], [address] Phone: [phone]   Fax:  [phone]  Physical Therapy Treatment  Patient Details  Name: Danielle Parsons MRN: [id number] Date of Birth: [birth year] Referring Provider (PT): Silverio Decamp, MD   Encounter Date: 11/06/2019  PT End of Session - 11/06/19 1016    Visit Number  7    Number of Visits  12    Date for PT Re-Evaluation  11/27/19    Authorization Type  medicare    PT Start Time  1017    PT Stop Time  1058    PT Time Calculation (min)  41 min    Activity Tolerance  Patient tolerated treatment well    Behavior During Therapy  [facility] for tasks assessed/performed       Past Medical History:  Diagnosis Date  . H/O bladder infections     Past Surgical History:  Procedure Laterality Date  . CESAREAN SECTION  1979    There were no vitals filed for this visit.  Subjective Assessment - 11/06/19 1016    Subjective  Pt reports she continues to have difficulty moving Rt arm behind back; unable to hook bra.   Other motions have improved; can move RUE (into horiz abdct and flexion) without pain.    Patient Stated Goals  reduce pain and "be the way I was before I hit the concrete."    Currently in Pain?  No/denies    Pain Score  0-No pain         [facility] PT Assessment - 11/06/19 0001      Assessment   Medical Diagnosis  M25.511,G89.29 (ICD-10-CM) - Chronic right shoulder pain    Referring Provider (PT)  Silverio Decamp, MD    Onset Date/Surgical Date  --   06/2019   Hand Dominance  Left    Next MD Visit  11/08/19    Prior Therapy  none      AROM   Right Shoulder Extension  54 Degrees    Right Shoulder Flexion  150 Degrees    Right Shoulder ABduction  142 Degrees    Right Shoulder Internal Rotation  --   palm to Rt buttock   Right Shoulder External Rotation  76 Degrees   standing, arm abdct ~80 deg   Right Elbow Flexion  145    Right Elbow  Extension  -5      Strength   Right Shoulder Flexion  4+/5   with pain   Right Shoulder Extension  5/5    Right Shoulder ABduction  4/5   with pain    Right Shoulder Internal Rotation  5/5    Right Shoulder External Rotation  5/5        [facility] Adult PT Treatment/Exercise - 11/06/19 0001      Shoulder Exercises: Supine   Horizontal ABduction  Both;10 reps;Strengthening    Theraband Level (Shoulder Horizontal ABduction)  Level 2 (Red)    External Rotation  Both;Strengthening;10 reps    Theraband Level (Shoulder External Rotation)  Level 1 (Yellow)    Flexion  AAROM;Both;5 reps   with cane   Diagonals  Strengthening;Right;10 reps    Theraband Level (Shoulder Diagonals)  Level 1 (Yellow)      Shoulder Exercises: Seated   Other Seated Exercises  shoulder rolls x 10       Shoulder Exercises: Standing   Internal Rotation  AAROM;Both;10 reps   behind back, then up over buttocks with  cane      Shoulder Exercises: ROM/Strengthening   Nustep  L5: arms/legs x 5 min       Shoulder Exercises: Stretch   Other Shoulder Stretches  bilat shoulder ext holding door frame x 20 sec x 2 reps      Manual Therapy   Soft tissue mobilization  STM to Rt subscap, infraspinatus, teres minor, pec major, bicep brachii, tricep - to decrease fascial restrictions and improve ROM.                  PT Long Term Goals - 11/06/19 1314      PT LONG TERM GOAL #1   Title  The patient will be indep with HEP.    Time  6    Period  Weeks    Status  On-going      PT LONG TERM GOAL #2   Title  The patient will reduce limitation per FOTO from 23% limitation up to 18% limitation.    Time  6    Period  Weeks    Status  On-going      PT LONG TERM GOAL #3   Title  The patient will  improve R elbow extension to -10 degreees extension in sitting.    Time  6    Period  Weeks    Status  Achieved      PT LONG TERM GOAL #4   Title  The patient will improve R shoulder strenth to 4+/5 to demo improved  functional use.    Time  6    Period  Weeks    Status  Partially Met      PT LONG TERM GOAL #5   Title  The patient will move R shoulder to end range flexion without pain.    Time  6    Period  Weeks    Status  Achieved            Plan - 11/06/19 1036    Clinical Impression Statement  Pt able to tolerate resisted shoulder exercises without pain or difficulty.  Her AROM has improved in Rt shoulder and elbow.  She is no longer reporting pain at end range of shoulder flexion, however does have some in elbow with terminal extension.  Pt continues with some strength deficits and pain with MMT of Rt shoulder flexion and abdct.  Pt has met LTG 3 and 5 and progresses gradually towards LTGs.    PT Frequency  2x / week    PT Duration  6 weeks    PT Treatment/Interventions  ADLs/Self Care Home Management;Therapeutic activities;Therapeutic exercise;Neuromuscular re-education;Electrical Stimulation;Cryotherapy;Iontophoresis 78m/ml Dexamethasone;Moist Heat;Ultrasound;Patient/family education;Taping;Dry needling;Passive range of motion;Manual techniques    PT Next Visit Plan  continue Rt shoulder ROM and strengthening.  add elbow ext stretch to HEP.  manual therapy and modalities as indicated.    PT Home Exercise Plan  Access Code: [email or web address]      Patient will benefit from skilled therapeutic intervention in order to improve the following deficits and impairments:     Visit Diagnosis: Acute pain of right shoulder  Muscle weakness (generalized)  Other symptoms and signs involving the musculoskeletal system     Problem List Patient Active Problem List   Diagnosis Date Noted  . Right elbow pain 09/27/2019  . Right shoulder pain 09/27/2019  . 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 Atrophic vaginitis 01/05/2018  . Female stress incontinence 01/05/2018  . GAD (generalized anxiety disorder) 02/18/2017  . Osteopenia of  multiple sites 02/18/2017  . Cystocele with rectocele 11/16/2016   Kerin Perna, PTA 11/06/19 1:15 PM  [place], [place], [address] Phone: [phone]   Fax:  [phone]  Name: Annalis Kaczmarczyk MRN: [id number] Date of Birth: Jan 23, 1946

## 2019-11-08 ENCOUNTER — Other Ambulatory Visit: Payer: Self-pay

## 2019-11-08 ENCOUNTER — Ambulatory Visit (INDEPENDENT_AMBULATORY_CARE_PROVIDER_SITE_OTHER): Payer: Medicare Other | Admitting: Sports Medicine

## 2019-11-08 DIAGNOSIS — M25511 Pain in right shoulder: Secondary | ICD-10-CM | POA: Diagnosis not present

## 2019-11-08 DIAGNOSIS — M25521 Pain in right elbow: Secondary | ICD-10-CM

## 2019-11-08 DIAGNOSIS — G8929 Other chronic pain: Secondary | ICD-10-CM

## 2019-11-08 NOTE — Progress Notes (Signed)
    Procedures performed today:    None.  Independent interpretation of tests performed by another provider:   None.  Impression and Recommendations:    Right elbow pain Danielle Parsons returns, she is a pleasant 74 year old female, she had a fall approximately 4 months ago. Elbow pain was diffuse with about 7 to 10 degrees of extension lag, x-rays at the time showed osteoarthritis. Meloxicam and physical therapy have provided good relief and she continues to improve, return to see me as needed for this.  Right shoulder pain As above Danielle Parsons had a fall about 4 months ago, meloxicam and physical therapy have improved her symptoms considerably, she still lacks some range of motion to internal rotation. 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 At this point as she continues to improve we are going to avoid interventional treatment, I can see her back as needed and if she plateaus.    ___________________________________________ Ihor Austin. Benjamin Stain, M.D., ABFM., CAQSM. Primary Care and Sports Medicine [facility]  Adjunct Instructor of Family Medicine  [employer]

## 2019-11-08 NOTE — Assessment & Plan Note (Signed)
Danielle Parsons returns, she is a pleasant 74 year old female, she had a fall approximately 4 months ago. Elbow pain was diffuse with about 7 to 10 degrees of extension lag, x-rays at the time showed osteoarthritis. Meloxicam and physical therapy have provided good relief and she continues to improve, return to see me as needed for this.

## 2019-11-08 NOTE — Assessment & Plan Note (Signed)
As above Danielle Parsons had a fall about 4 months ago, meloxicam and physical therapy have improved her symptoms considerably, she still lacks some range of motion to internal rotation. 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 At this point as she continues to improve we are going to avoid interventional treatment, I can see her back as needed and if she plateaus.

## 2019-11-09 ENCOUNTER — Encounter: Payer: Medicare Other | Admitting: Rehabilitative and Restorative Service Providers"

## 2019-11-13 ENCOUNTER — Other Ambulatory Visit: Payer: Self-pay

## 2019-11-13 ENCOUNTER — Ambulatory Visit (INDEPENDENT_AMBULATORY_CARE_PROVIDER_SITE_OTHER): Payer: Medicare Other | Admitting: Physical Therapy

## 2019-11-13 ENCOUNTER — Encounter: Payer: Self-pay | Admitting: Physical Therapy

## 2019-11-13 DIAGNOSIS — R29898 Other symptoms and signs involving the musculoskeletal system: Secondary | ICD-10-CM

## 2019-11-13 DIAGNOSIS — M6281 Muscle weakness (generalized): Secondary | ICD-10-CM | POA: Diagnosis not present

## 2019-11-13 DIAGNOSIS — M25511 Pain in right shoulder: Secondary | ICD-10-CM

## 2019-11-13 NOTE — Patient Instructions (Signed)
Access Code: [email or web address]  URL: [email or web address]  Date: 11/13/2019  Prepared by: Mayer Camel   Exercises  Supine Chest Stretch with Elbows Bent - 3 reps - 1 sets - 20 seconds hold - 1x daily - 7x weekly  Seated Shoulder W - 10 reps - 1 sets - 3 seconds hold - 1x daily - 7x weekly  Corner Pec Major Stretch - 2-3 reps - 1 sets - 15 seconds hold - 1x daily - 7x weekly  Standing Shoulder Extension with Dowel - 10 reps - 1 sets - 3 seconds hold - 1x daily - 7x weekly  Standing Row with Anchored Resistance - 10 reps - 1 sets - 1x daily - 7x weekly  Standing Shoulder Horizontal Abduction with Resistance - 10 reps - 1 sets - 1x daily - 7x weekly  Standing Single Arm Shoulder Abduction with Dumbbell - Thumb Up - 10 reps - 1 sets - 1x daily - 7x weekly  Standing Shoulder External Rotation with Resistance - 10 reps - 1 sets - 1x daily - 7x weekly  Sidelying Thoracic Rotation with Open Book - 5-10 reps - 1 sets - 5 seconds hold - 1x daily - 7x weekly  Standing Shoulder Internal Rotation Stretch with Hands Behind Back - 5 reps - 1 sets - 10-15 hold - 2x daily - 7x weekly

## 2019-11-13 NOTE — Therapy (Addendum)
[place], [place], [address] Phone: [phone]   Fax:  [phone]  Physical Therapy Treatment and Discharge Summary  Patient Details  Name: Danielle Parsons MRN: [id number] Date of Birth: 1946/01/23 Referring Provider (PT): Silverio Decamp, MD   Encounter Date: 11/13/2019  PT End of Session - 11/13/19 1027    Visit Number  8    Number of Visits  12    Date for PT Re-Evaluation  11/27/19    Authorization Type  medicare    PT Start Time  1018    PT Stop Time  1100    PT Time Calculation (min)  42 min    Activity Tolerance  Patient tolerated treatment well    Behavior During Therapy  [facility] for tasks assessed/performed       Past Medical History:  Diagnosis Date  . H/O bladder infections     Past Surgical History:  Procedure Laterality Date  . CESAREAN SECTION  1979    There were no vitals filed for this visit.  Subjective Assessment - 11/13/19 1022    Subjective  Pt reports she is able to take her coat off with less difficulty and pain.  Still unable to unhook bra.    Patient Stated Goals  reduce pain and "be the way I was before I hit the concrete."    Currently in Pain?  No/denies    Pain Score  0-No pain         [facility] PT Assessment - 11/13/19 0001      Assessment   Medical Diagnosis  M25.511,G89.29 (ICD-10-CM) - Chronic right shoulder pain    Referring Provider (PT)  Silverio Decamp, MD    Onset Date/Surgical Date  --   06/2019   Hand Dominance  Left    Next MD Visit  PRN    Prior Therapy  none      Observation/Other Assessments   Focus on Therapeutic Outcomes (FOTO)   83% (17% limited)      AROM   Right Shoulder Extension  54 Degrees      [facility] Adult PT Treatment/Exercise - 11/13/19 0001      Elbow Exercises   Elbow Extension  Right;10 reps    Theraband Level (Elbow Extension)  Level 2 (Red)      Shoulder Exercises: Seated   Other Seated Exercises  W's x  5 sec hold x 5 reps      Shoulder Exercises: Standing   Horizontal ABduction  Right;10 reps    Theraband Level (Shoulder Horizontal ABduction)  Level 2 (Red)    External Rotation  Both;Strengthening;10 reps;Theraband    Theraband Level (Shoulder External Rotation)  Level 2 (Red)    Flexion  Strengthening;Right;5 reps;10 reps;Left   2 sets   Shoulder Flexion Weight (lbs)  3,2    ABduction  Strengthening;Right;10 reps    Shoulder ABduction Weight (lbs)  2   3# too heavy   Row  Strengthening;Right;10 reps    Theraband Level (Shoulder Row)  Level 2 (Red)      Shoulder Exercises: ROM/Strengthening   Nustep  L4-5: arms/legs x 5 min       Shoulder Exercises: Stretch   Corner Stretch  2 reps;20 seconds    Internal Rotation Stretch  5 reps   10 sec holds   Internal Rotation Stretch Limitations  multiple cues for form and technique     Other Shoulder Stretches  bilat shoulder ext holding door frame  x 20 sec x 2 reps      Manual Therapy   Soft tissue mobilization  STM to Rt subscap, infraspinatus, teres minor, pec major, bicep brachii, tricep - to decrease fascial restrictions and improve ROM.        PT Education - 11/13/19 1124    Education Details  HEP    Person(s) Educated  Patient    Methods  Explanation;Handout;Verbal cues;Tactile cues;Demonstration    Comprehension  Verbalized understanding;Returned demonstration          PT Long Term Goals - 11/13/19 1118      PT LONG TERM GOAL #1   Title  The patient will be indep with HEP.    Time  6    Period  Weeks    Status  Partially Met      PT LONG TERM GOAL #2   Title  The patient will reduce limitation per FOTO from 23% limitation up to 18% limitation.    Time  6    Period  Weeks    Status  Achieved      PT LONG TERM GOAL #3   Title  The patient will  improve R elbow extension to -10 degreees extension in sitting.    Time  6    Period  Weeks    Status  Achieved      PT LONG TERM GOAL #4   Title  The patient will  improve R shoulder strenth to 4+/5 to demo improved functional use.    Time  6    Period  Weeks    Status  Partially Met      PT LONG TERM GOAL #5   Title  The patient will move R shoulder to end range flexion without pain.    Time  6    Period  Weeks    Status  Achieved            Plan - 11/13/19 1118    Clinical Impression Statement  Pt making gradual progress with Rt shoulder IR AROM.  She was able to tolerate standing resisted exercises without increase in pain.  Pt has met LTG #2 and is making good gains towards remaining goals each visit. Pt verbalized interest in holding therapy while she continues to work on [employer].    Rehab Potential  Good    PT Frequency  2x / week    PT Duration  6 weeks    PT Treatment/Interventions  ADLs/Self Care Home Management;Therapeutic activities;Therapeutic exercise;Neuromuscular re-education;Electrical Stimulation;Cryotherapy;Iontophoresis 25m/ml Dexamethasone;Moist Heat;Ultrasound;Patient/family education;Taping;Dry needling;Passive range of motion;Manual techniques    PT Next Visit Plan  will hold until 3/29, per pt request.  If pt doesn't return, will d/c.    PT Home Exercise Plan  Access Code: [email or web address]      Patient will benefit from skilled therapeutic intervention in order to improve the following deficits and impairments:  Pain, Postural dysfunction, Decreased strength, Decreased range of motion  Visit Diagnosis: Acute pain of right shoulder  Muscle weakness (generalized)  Other symptoms and signs involving the musculoskeletal system     Problem List Patient Active Problem List   Diagnosis Date Noted  . Right elbow pain 09/27/2019  . Right shoulder pain 09/27/2019  . 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 Atrophic vaginitis 01/05/2018  . Female stress incontinence 01/05/2018  . GAD (generalized anxiety disorder) 02/18/2017  . Osteopenia of multiple sites 02/18/2017  . Cystocele with rectocele 11/16/2016    PHYSICAL THERAPY  DISCHARGE SUMMARY  Visits from Start of Care: 8   Current functional level related to goals / functional outcomes: See goals above   Remaining deficits: Patient was progressing well with partially met LTGs.  She was continuing HEP on her own and planned to f/u if needed.  No further contact.    Education / Equipment: Home program, posture.  Plan: Patient agrees to discharge.  Patient goals were partially met. Patient is being discharged due to meeting the stated rehab goals.  ?????          Thank you for the referral of this patient. Rudell Cobb, MPT  Kerin Perna, [place] 11/13/19 11:26 AM  [facility] [place], [place], [address] Phone: [phone]   Fax:  [phone]  Name: Danielle Parsons MRN: [id number] Date of Birth: [birth year]

## 2019-11-16 ENCOUNTER — Encounter: Payer: Medicare Other | Admitting: Physical Therapy

## 2020-09-12 ENCOUNTER — Encounter: Payer: Self-pay | Admitting: Emergency Medicine

## 2020-09-12 ENCOUNTER — Emergency Department (INDEPENDENT_AMBULATORY_CARE_PROVIDER_SITE_OTHER)
Admission: EM | Admit: 2020-09-12 | Discharge: 2020-09-12 | Disposition: A | Discharge status: Home / Self Care | Payer: Medicare Other | Source: Home / Self Care

## 2020-09-12 ENCOUNTER — Other Ambulatory Visit: Payer: Self-pay

## 2020-09-12 DIAGNOSIS — Z23 Encounter for immunization: Secondary | ICD-10-CM | POA: Diagnosis not present

## 2020-09-12 DIAGNOSIS — S0502XA Injury of conjunctiva and corneal abrasion without foreign body, left eye, initial encounter: Secondary | ICD-10-CM | POA: Diagnosis not present

## 2020-09-12 MED ORDER — TETANUS-DIPHTH-ACELL PERTUSSIS 5-2.5-18.5 LF-MCG/0.5 IM SUSY
0.5000 mL | PREFILLED_SYRINGE | Freq: Once | INTRAMUSCULAR | Status: AC
  Administered 2020-09-12: 12:00:00 0.5 mL via INTRAMUSCULAR

## 2020-09-12 MED ORDER — GENTAMICIN SULFATE 0.3 % OP SOLN: 2.0000 [drp] | Freq: Four times a day (QID) | OPHTHALMIC | 0 refills | Status: AC

## 2020-09-12 NOTE — ED Triage Notes (Signed)
Left eye hurting x 3 days, feels like she got something in it while taking down decorations and might have scratched it.

## 2020-09-12 NOTE — ED Provider Notes (Signed)
[facility]    CSN: [id number] Arrival date & time: 09/12/20  3875      History   Chief Complaint Chief Complaint  Patient presents with  . Eye Pain    left    HPI Danielle Parsons is a 74 y.o. female.   HPI  Danielle Parsons is a 74 y.o. female presenting to UC with c/o Left eye pain and irritation for 3 days. Pt felt like she got something in her eye while taking down decorations.  She did wear contacts yesterday, which made the pain worse. She is concerned she scratched her eye. No change in vision that she has noticed.    Last Tdap was in 2014 per [facility]   Past Medical History:  Diagnosis Date  . H/O bladder infections     Patient Active Problem List   Diagnosis Date Noted  . Right elbow pain 09/27/2019  . Right shoulder pain 09/27/2019  . 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 Atrophic vaginitis 01/05/2018  . Female stress incontinence 01/05/2018  . GAD (generalized anxiety disorder) 02/18/2017  . Osteopenia of multiple sites 02/18/2017  . Cystocele with rectocele 11/16/2016    Past Surgical History:  Procedure Laterality Date  . CESAREAN SECTION  1979    OB History   No obstetric history on file.      Home Medications    Prior to Admission medications   Medication Sig Start Date End Date Taking? Authorizing Provider  cephALEXin (KEFLEX) 250 MG capsule Take by mouth 4 (four) times daily.   Yes [provider]  gentamicin (GARAMYCIN) 0.3 % ophthalmic solution Place 2 drops into the left eye 4 (four) times daily for 5 days. 09/12/20 09/17/20 Yes Alisabeth Selkirk O, PA-C  venlafaxine (EFFEXOR) 75 MG tablet Take 75 mg by mouth 2 (two) times daily.    [provider]    Family History Family History  Problem Relation Age of Onset  . Multiple myeloma Father   . Alzheimer's disease Mother     Social History Social History   Tobacco Use  . Smoking status: Never Smoker  . Smokeless tobacco: Never Used  Vaping Use  . Vaping Use:  Never used  Substance Use Topics  . Alcohol use: No  . Drug use: No     Allergies   Patient has no known allergies.   Review of Systems Review of Systems  Constitutional: Negative for chills and fever.  HENT: Positive for congestion (mild) and rhinorrhea (clear). Negative for sinus pressure and sinus pain.   Eyes: Positive for pain, discharge (watery) and itching. Negative for photophobia, redness and visual disturbance.  Neurological: Negative for dizziness and headaches.     Physical Exam Triage Vital Signs ED Triage Vitals  Enc Vitals Group     BP 09/12/20 1051 108/73     Pulse Rate 09/12/20 1051 74     Resp --      Temp 09/12/20 1051 (!) 97.5 F (36.4 C)     Temp Source 09/12/20 1051 Oral     SpO2 09/12/20 1051 98 %     Weight 09/12/20 1052 145 lb (65.8 kg)     Height 09/12/20 1052 5' (1.524 m)     Head Circumference --      Peak Flow --      Pain Score 09/12/20 1052 3     Pain Loc --      Pain Edu? --      Excl. in [place]? --  No data found.  Updated Vital Signs BP 108/73 (BP Location: Right Arm)   Pulse 74   Temp (!) 97.5 F (36.4 C) (Oral)   Ht 5' (1.524 m)   Wt 145 lb (65.8 kg)   SpO2 98%   BMI 28.32 kg/m   Visual Acuity Right Eye Distance: 20/40 Left Eye Distance: 20/70 Bilateral Distance: 20/40 (with correction)  Right Eye Near:   Left Eye Near:    Bilateral Near:     Physical Exam Vitals and nursing note reviewed.  Constitutional:      Appearance: Normal appearance. She is well-developed and well-nourished.  HENT:     Head: Normocephalic and atraumatic.     Nose: Nose normal.  Eyes:     General: Lids are normal. Lids are everted, no foreign bodies appreciated. Vision grossly intact. Gaze aligned appropriately. No scleral icterus.       Right eye: No foreign body or discharge.        Left eye: No foreign body or discharge.     Extraocular Movements: Extraocular movements intact and EOM normal.     Conjunctiva/sclera: Conjunctivae  normal.     Pupils: Pupils are equal, round, and reactive to light.   Cardiovascular:     Rate and Rhythm: Normal rate.  Pulmonary:     Effort: Pulmonary effort is normal. No respiratory distress.  Musculoskeletal:        General: Normal range of motion.     Cervical back: Normal range of motion.  Skin:    General: Skin is warm and dry.  Neurological:     Mental Status: She is alert and oriented to person, place, and time.  Psychiatric:        Mood and Affect: Mood and affect normal.        Behavior: Behavior normal.      UC Treatments / Results  Labs (all labs ordered are listed, but only abnormal results are displayed) Labs Reviewed - No data to display  EKG   Radiology No results found.  Procedures Procedures (including critical care time)  Medications Ordered in UC Medications  Tdap (BOOSTRIX) injection 0.5 mL (0.5 mLs Intramuscular Given 09/12/20 1153)    Initial Impression / Assessment and Plan / UC Course  I have reviewed the triage vital signs and the nursing notes.  Pertinent labs & imaging results that were available during my care of the patient were reviewed by me and considered in my medical decision making (see chart for details).     Hx and exam c/w corneal abrasion Tdap updated Rx: gentamicin F/u with eye specialist next week AVS given   Final Clinical Impressions(s) / UC Diagnoses   Final diagnoses:  Corneal abrasion, left, initial encounter     Discharge Instructions      Avoid wearing your contacts until your eye feels back to normal. It is recommended you call to schedule an appointment with your eye specialist next week for recheck of symptoms and to update your eyeglass prescription.     ED Prescriptions    Medication Sig Dispense Auth. Provider   gentamicin (GARAMYCIN) 0.3 % ophthalmic solution Place 2 drops into the left eye 4 (four) times daily for 5 days. 5 mL Noe Gens, PA-C     PDMP not reviewed this  encounter.   Noe Gens, [place] 09/12/20 1849

## 2020-09-12 NOTE — Discharge Instructions (Signed)
  Avoid wearing your contacts until your eye feels back to normal. It is recommended you call to schedule an appointment with your eye specialist next week for recheck of symptoms and to update your eyeglass prescription.

## 2021-05-09 ENCOUNTER — Emergency Department (INDEPENDENT_AMBULATORY_CARE_PROVIDER_SITE_OTHER): Payer: Medicare Other

## 2021-05-09 ENCOUNTER — Other Ambulatory Visit: Payer: Self-pay

## 2021-05-09 ENCOUNTER — Emergency Department (INDEPENDENT_AMBULATORY_CARE_PROVIDER_SITE_OTHER)
Admission: RE | Admit: 2021-05-09 | Discharge: 2021-05-09 | Disposition: A | Discharge status: Home / Self Care | Payer: Medicare Other | Source: Ambulatory Visit

## 2021-05-09 VITALS — BP 122/78 | HR 77 | Temp 98.4°F | Resp 16

## 2021-05-09 DIAGNOSIS — R059 Cough, unspecified: Secondary | ICD-10-CM

## 2021-05-09 DIAGNOSIS — J0100 Acute maxillary sinusitis, unspecified: Secondary | ICD-10-CM | POA: Diagnosis not present

## 2021-05-09 DIAGNOSIS — J309 Allergic rhinitis, unspecified: Secondary | ICD-10-CM

## 2021-05-09 MED ORDER — FEXOFENADINE HCL 180 MG PO TABS: 180.0000 mg | ORAL_TABLET | Freq: Every day | ORAL | 0 refills | Status: DC

## 2021-05-09 MED ORDER — BENZONATATE 200 MG PO CAPS: 200.0000 mg | ORAL_CAPSULE | Freq: Three times a day (TID) | ORAL | 0 refills | Status: AC | PRN

## 2021-05-09 MED ORDER — METHYLPREDNISOLONE ACETATE 80 MG/ML IJ SUSP
80.0000 mg | Freq: Once | INTRAMUSCULAR | Status: AC
  Administered 2021-05-09: 80 mg via INTRAMUSCULAR

## 2021-05-09 MED ORDER — AMOXICILLIN-POT CLAVULANATE 875-125 MG PO TABS: 1.0000 | ORAL_TABLET | Freq: Two times a day (BID) | ORAL | 0 refills | Status: DC

## 2021-05-09 MED ORDER — METHYLPREDNISOLONE 4 MG PO TBPK: ORAL_TABLET | ORAL | 0 refills | Status: DC

## 2021-05-09 NOTE — ED Provider Notes (Signed)
[facility]    CSN: [id number] Arrival date & time: 05/09/21  1338      History   Chief Complaint Chief Complaint  Patient presents with   Cough    HPI Danielle Parsons is a 75 y.o. female.   HPI 75 year old female presents with severe cough for 10 days.  Reports OTC cough medications have provided little to no relief.  Patient is vaccinated for COVID-19, has received boosters and denies fever or recent COVID-19 exposure.  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      Home Medications    Prior to Admission medications   Medication Sig Start Date End Date Taking? Authorizing Provider  amoxicillin-clavulanate (AUGMENTIN) 875-125 MG tablet Take 1 tablet by mouth every 12 (twelve) hours. 05/09/21  Yes Eliezer Lofts, FNP  benzonatate (TESSALON) 200 MG capsule Take 1 capsule (200 mg total) by mouth 3 (three) times daily as needed for up to 7 days for cough. 05/09/21 05/16/21 Yes Eliezer Lofts, FNP  fexofenadine [facility] ALLERGY) 180 MG tablet Take 1 tablet (180 mg total) by mouth daily for 15 days. 05/09/21 05/24/21 Yes Eliezer Lofts, FNP  methylPREDNISolone (MEDROL DOSEPAK) 4 MG TBPK tablet Take as directed 05/09/21  Yes Eliezer Lofts, FNP  cephALEXin (KEFLEX) 250 MG capsule Take by mouth 4 (four) times daily.    [provider]  venlafaxine (EFFEXOR) 75 MG tablet Take 75 mg by mouth 2 (two) times daily.    [provider]    Family History Family  History  Problem Relation Age of Onset   Multiple myeloma Father    Alzheimer's disease Mother     Social History Social History   Tobacco Use   Smoking status: Never   Smokeless tobacco: Never  Vaping Use   Vaping Use: Never used  Substance Use Topics   Alcohol use: No   Drug use: No     Allergies   Patient has no known allergies.   Review of Systems Review of Systems  HENT:  Positive for congestion, postnasal drip and sore throat.   Respiratory:  Positive for cough.   All other systems reviewed and are negative.   Physical Exam Triage Vital Signs ED Triage Vitals  Enc Vitals Group     BP 05/09/21 1359 122/78     Pulse Rate 05/09/21 1359 77     Resp 05/09/21 1359 16     Temp 05/09/21 1359 98.4 F (36.9 C)     Temp Source 05/09/21 1359 Oral     SpO2 05/09/21 1359 96 %     Weight --      Height --      Head Circumference --      Peak Flow --      Pain Score 05/09/21 1400 0     Pain Loc --      Pain Edu? --      Excl. in [place]? --    No data  found.  Updated Vital Signs BP 122/78   Pulse 77   Temp 98.4 F (36.9 C) (Oral)   Resp 16   SpO2 96%      Physical Exam Vitals and nursing note reviewed.  Constitutional:      General: She is not in acute distress.    Appearance: Normal appearance. She is normal weight. She is ill-appearing. She is not toxic-appearing or diaphoretic.  HENT:     Head: Normocephalic and atraumatic.     Right Ear: Tympanic membrane and external ear normal.     Left Ear: Tympanic membrane and external ear normal.     Ears:     Comments: Eustachian tube dysfunction noted bilaterally     Nose: Congestion present.     Comments: turbinates are erythematous    Mouth/Throat:     Mouth: Mucous membranes are moist.     Pharynx: Oropharynx is clear.     Comments: Moderate to significant amount of clear drainage of posterior oropharynx noted Eyes:     Extraocular Movements: Extraocular movements intact.     Conjunctiva/sclera:  Conjunctivae normal.     Pupils: Pupils are equal, round, and reactive to light.  Cardiovascular:     Rate and Rhythm: Normal rate and regular rhythm.     Pulses: Normal pulses.     Heart sounds: Normal heart sounds.  Pulmonary:     Effort: Pulmonary effort is normal.     Breath sounds: Normal breath sounds. No wheezing, rhonchi or rales.     Comments: Tightness of upper airways and infrequent nonproductive cough noted on exam Musculoskeletal:        General: Normal range of motion.  Skin:    General: Skin is warm and dry.  Neurological:     General: No focal deficit present.     Mental Status: She is alert and oriented to person, place, and time. Mental status is at baseline.  Psychiatric:        Mood and Affect: Mood normal.        Behavior: Behavior normal.        Thought Content: Thought content normal.     UC Treatments / Results  Labs (all labs ordered are listed, but only abnormal results are displayed) Labs Reviewed - No data to display  EKG   Radiology DG Chest 2 View  Result Date: 05/09/2021 CLINICAL DATA:  Cough EXAM: CHEST - 2 VIEW COMPARISON:  Chest radiograph 02/15/2015 FINDINGS: The cardiomediastinal silhouette is normal. The lungs are well inflated. There is no focal consolidation or pulmonary edema. There is no pleural effusion or pneumothorax. There is multilevel degenerative change with S shaped scoliosis of the thoracolumbar spine. IMPRESSION: No radiographic evidence of acute cardiopulmonary process. Electronically Signed   By: Valetta Mole M.D.   On: 05/09/2021 14:33    Procedures Procedures (including critical care time)  Medications Ordered in UC Medications  methylPREDNISolone acetate (DEPO-MEDROL) injection 80 mg (has no administration in time range)    Initial Impression / Assessment and Plan / UC Course  I have reviewed the triage vital signs and the nursing notes.  Pertinent labs & imaging results that were available during my care of the  patient were reviewed by me and considered in my medical decision making (see chart for details).     MDM: 1.  Cough-CXR revealed no acute cardiopulmonary process, IM Depo-Medrol 80 given once in clinic prior to discharge, Rx Medrol Dosepak and Tessalon Perles; 2.  Subacute maxillary sinusitis-Rx'd  Augmentin 3.  Allergic rhinitis-Rx'd Allegra, Advised/instructed patient to take medication as directed with food to completion.  Advised/instructed patient not to start Medrol Dosepak until tomorrow morning, Saturday, 05/10/2021.  Advised/encouraged patient to increase daily water intake while taking these medications.  Patient discharged home, hemodynamically stable. Final Clinical Impressions(s) / UC Diagnoses   Final diagnoses:  Cough  Subacute maxillary sinusitis  Allergic rhinitis, unspecified seasonality, unspecified trigger     Discharge Instructions      Advised/instructed patient to take medication as directed with food to completion.  Advised/instructed patient not to start Medrol Dosepak until tomorrow morning, Saturday, 05/10/2021.  Advised/encouraged patient to increase daily water intake while taking these medications.     ED Prescriptions     Medication Sig Dispense Auth. Provider   amoxicillin-clavulanate (AUGMENTIN) 875-125 MG tablet Take 1 tablet by mouth every 12 (twelve) hours. 14 tablet Eliezer Lofts, FNP   fexofenadine [facility] ALLERGY) 180 MG tablet Take 1 tablet (180 mg total) by mouth daily for 15 days. 15 tablet Eliezer Lofts, FNP   benzonatate (TESSALON) 200 MG capsule Take 1 capsule (200 mg total) by mouth 3 (three) times daily as needed for up to 7 days for cough. 30 capsule Eliezer Lofts, FNP   methylPREDNISolone (MEDROL DOSEPAK) 4 MG TBPK tablet Take as directed 1 each Eliezer Lofts, FNP      PDMP not reviewed this encounter.   Eliezer Lofts, FNP 05/09/21 1500

## 2021-05-09 NOTE — Discharge Instructions (Addendum)
Advised/instructed patient to take medication as directed with food to completion.  Advised/instructed patient not to start Medrol Dosepak until tomorrow morning, Saturday, 05/10/2021.  Advised/encouraged patient to increase daily water intake while taking these medications.

## 2021-05-09 NOTE — ED Triage Notes (Signed)
Pt present a severe cough with white mucous coming up, pt states the cough started on Aug 17. Pt has tried OTC medications with no relief.

## 2022-09-09 ENCOUNTER — Ambulatory Visit
Admission: EM | Admit: 2022-09-09 | Discharge: 2022-09-09 | Disposition: A | Discharge status: Home / Self Care | Payer: Medicare Other | Attending: Family Medicine | Admitting: Family Medicine

## 2022-09-09 DIAGNOSIS — M533 Sacrococcygeal disorders, not elsewhere classified: Secondary | ICD-10-CM

## 2022-09-09 MED ORDER — METHYLPREDNISOLONE 4 MG PO TBPK: ORAL_TABLET | ORAL | 0 refills | Status: DC

## 2022-09-09 MED ORDER — TIZANIDINE HCL 4 MG PO TABS: 4.0000 mg | ORAL_TABLET | Freq: Every day | ORAL | 0 refills | Status: DC

## 2022-09-09 NOTE — ED Provider Notes (Signed)
[name]    CSN: [id number] Arrival date & time: 09/09/22  1051      History   Chief Complaint Chief Complaint  Patient presents with   Back Pain    HPI Danielle Parsons is a 76 y.o. female.   HPI  Patient states that she has had pain in her left low back for the last 2 weeks.  It hurts when she gets up in the morning and with certain movements.  She has had no accident or injury.  No heavy lifting.  No fall.  She has never had back problems before.  She has tried taking over-the-counter medicines.  She can get temporary improvement only.  No numbness or weakness into the legs.  No bowel or bladder complaint.  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      Home Medications    Prior to Admission medications   Medication Sig Start Date End Date Taking? Authorizing Provider  methylPREDNISolone (MEDROL DOSEPAK) 4 MG TBPK tablet tad 09/09/22  Yes Raylene Everts, MD  tiZANidine (ZANAFLEX) 4 MG tablet Take 1-2 tablets (4-8 mg total) by mouth at bedtime. 09/09/22  Yes Raylene Everts, MD  venlafaxine [facility]) 75 MG tablet Take 75 mg by mouth 2 (two) times daily.    [provider]    Family History Family History  Problem Relation Age of Onset   Multiple myeloma Father    Alzheimer's disease Mother     Social History Social History   Tobacco Use   Smoking status: Never   Smokeless tobacco: Never  Vaping Use   Vaping Use:  Never used  Substance Use Topics   Alcohol use: No   Drug use: No     Allergies   Patient has no known allergies.   Review of Systems Review of Systems See HPI  Physical Exam Triage Vital Signs ED Triage Vitals  Enc Vitals Group     BP 09/09/22 1105 132/78     Pulse Rate 09/09/22 1105 69     Resp 09/09/22 1105 14     Temp 09/09/22 1105 98.6 F (37 C)     Temp Source 09/09/22 1105 Oral     SpO2 09/09/22 1105 96 %     Weight --      Height --      Head Circumference --      Peak Flow --      Pain Score 09/09/22 1102 4     Pain Loc --      Pain Edu? --      Excl. in [place]? --    No data found.  Updated Vital Signs BP 132/78 (BP Location: Right Arm)   Pulse 69   Temp 98.6 F (37 C) (Oral)   Resp 14   SpO2 96%      Physical Exam Musculoskeletal:        General: Tenderness present.  Comments: Tenderness directly over left sacroiliac joint.  Mild increased muscle tone in the left lumbar column of muscles.  Lower extremity exam is normal, symmetric      UC Treatments / Results  Labs (all labs ordered are listed, but only abnormal results are displayed) Labs Reviewed - No data to display  EKG   Radiology No results found.  Procedures Procedures (including critical care time)  Medications Ordered in UC Medications - No data to display  Initial Impression / Assessment and Plan / UC Course  I have reviewed the triage vital signs and the nursing notes.  Pertinent labs & imaging results that were available during my care of the patient were reviewed by me and considered in my medical decision making (see chart for details).      Final Clinical Impressions(s) / UC Diagnoses   Final diagnoses:  SI (sacroiliac) pain     Discharge Instructions      Try warmth to the area Limit activity while back is painful Take the Medrol Dosepak as directed.  This is a steroid that we will take down pain and inflammation Take the muscle relaxer at bedtime  until you are improved See your doctor if this pain persists or worsens     ED Prescriptions     Medication Sig Dispense Auth. Provider   methylPREDNISolone (MEDROL DOSEPAK) 4 MG TBPK tablet tad 21 tablet Raylene Everts, MD   tiZANidine (ZANAFLEX) 4 MG tablet Take 1-2 tablets (4-8 mg total) by mouth at bedtime. 20 tablet Raylene Everts, MD      PDMP not reviewed this encounter.   Raylene Everts, MD 09/09/22 [phone]

## 2022-09-09 NOTE — ED Triage Notes (Signed)
Pt presents with c/o lower back pain "for a couple of weeks". NKI. Pain increases when she wakes up or crosses her legs.

## 2022-09-09 NOTE — Discharge Instructions (Signed)
Try warmth to the area Limit activity while back is painful Take the Medrol Dosepak as directed.  This is a steroid that we will take down pain and inflammation Take the muscle relaxer at bedtime until you are improved See your doctor if this pain persists or worsens

## 2022-11-01 ENCOUNTER — Ambulatory Visit
Admission: EM | Admit: 2022-11-01 | Discharge: 2022-11-01 | Disposition: A | Discharge status: Home / Self Care | Payer: Medicare Other | Attending: Family Medicine | Admitting: Family Medicine

## 2022-11-01 ENCOUNTER — Encounter: Payer: Self-pay | Admitting: Emergency Medicine

## 2022-11-01 ENCOUNTER — Ambulatory Visit (INDEPENDENT_AMBULATORY_CARE_PROVIDER_SITE_OTHER): Payer: Medicare Other

## 2022-11-01 DIAGNOSIS — R059 Cough, unspecified: Secondary | ICD-10-CM | POA: Diagnosis not present

## 2022-11-01 DIAGNOSIS — R0989 Other specified symptoms and signs involving the circulatory and respiratory systems: Secondary | ICD-10-CM

## 2022-11-01 DIAGNOSIS — J189 Pneumonia, unspecified organism: Secondary | ICD-10-CM | POA: Diagnosis not present

## 2022-11-01 MED ORDER — AMOXICILLIN-POT CLAVULANATE 875-125 MG PO TABS: 1.0000 | ORAL_TABLET | Freq: Two times a day (BID) | ORAL | 0 refills | Status: DC

## 2022-11-01 MED ORDER — AZITHROMYCIN 250 MG PO TABS: ORAL_TABLET | ORAL | 0 refills | Status: DC

## 2022-11-01 MED ORDER — BENZONATATE 200 MG PO CAPS: 200.0000 mg | ORAL_CAPSULE | Freq: Three times a day (TID) | ORAL | 0 refills | Status: DC | PRN

## 2022-11-01 NOTE — ED Notes (Signed)
D/C instructions reviewed w/ Danielle Parsons & her daughter Danielle Parsons  over the phi=one w/ Danielle Parsons's permission. Danielle Parsons's son-in-law is a nurse & will check on pt & will also check pulse ox at home. Pt will also take a probiotic OTC while on  antibiotic.

## 2022-11-01 NOTE — ED Triage Notes (Signed)
Cough x 9 days w/ sinus pressure  OTC  Robitussin  & mucinex - min relief Cough drops  Denies  fever or chills

## 2022-11-01 NOTE — Discharge Instructions (Addendum)
You have pneumonia.  You need to take 2 antibiotics.  Take the Augmentin 2 times a day.  Take the Z-Pak as directed.  While you are on strong antibiotics, I recommend you take a probiotic to prevent stomach upset Drink lots of water.  Run a humidifier if you have 1 available Take Tessalon pills for cough. Get plenty of rest Call on Monday to make an appointment and see your doctor in 3 to 4 weeks.  She will want to reexamine your lungs and maybe get an x-ray to make sure your pneumonia has resolved

## 2022-11-01 NOTE — ED Provider Notes (Signed)
[facility]    CSN: [id number] Arrival date & time: 11/01/22  [id number]      History   Chief Complaint Chief Complaint  Patient presents with   Cough    HPI Danielle Parsons is a 77 y.o. female.   HPI Patient is here for cough and cold.  She feels like she has a cold that is persisting, her daughter wanted her seen.  The daughter states "you might have pneumonia" judging by the harsh cough that is persisting.  Patient feels very tired.  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      Home Medications    Prior to Admission medications   Medication Sig Start Date End Date Taking? Authorizing Provider  amoxicillin-clavulanate (AUGMENTIN) 875-125 MG tablet Take 1 tablet by mouth every 12 (twelve) hours. 11/01/22  Yes Raylene Everts, MD  azithromycin (ZITHROMAX Z-PAK) 250 MG tablet Take two pills today followed by one a day until gone 11/01/22  Yes Raylene Everts, MD  benzonatate (TESSALON) 200 MG capsule Take 1 capsule (200 mg total) by mouth 3 (three) times daily as needed for cough. 11/01/22  Yes Raylene Everts, MD  venlafaxine [facility]) 75 MG tablet Take 75 mg by mouth 2 (two) times daily.    [provider]    Family History Family History  Problem Relation Age of Onset   Alzheimer's disease Mother    Multiple  myeloma Father     Social History Social History   Tobacco Use   Smoking status: Never   Smokeless tobacco: Never  Vaping Use   Vaping Use: Never used  Substance Use Topics   Alcohol use: No   Drug use: No     Allergies   Patient has no known allergies.   Review of Systems Review of Systems  See HPI Physical Exam Triage Vital Signs ED Triage Vitals  Enc Vitals Group     BP 11/01/22 0932 134/79     Pulse Rate 11/01/22 0932 70     Resp 11/01/22 0932 16     Temp 11/01/22 0932 97.7 F (36.5 C)     Temp Source 11/01/22 0932 Oral     SpO2 11/01/22 0932 96 %     Weight 11/01/22 0934 140 lb (63.5 kg)     Height 11/01/22 0934 5' (1.524 m)     Head Circumference --      Peak Flow --      Pain Score 11/01/22 0934 0     Pain Loc --      Pain Edu? --      Excl. in [place]? --    No data found.  Updated Vital Signs BP 134/79 (BP Location: Left Arm)   Pulse 70   Temp 97.7 F (36.5 C) (Oral)   Resp  16   Ht 5' (1.524 m)   Wt 63.5 kg   SpO2 96%   BMI 27.34 kg/m       Physical Exam Constitutional:      General: She is not in acute distress.    Appearance: She is well-developed. She is ill-appearing.  HENT:     Head: Normocephalic and atraumatic.     Right Ear: Tympanic membrane and ear canal normal.     Left Ear: Tympanic membrane and ear canal normal.     Nose: No congestion.     Mouth/Throat:     Mouth: Mucous membranes are moist.     Pharynx: No posterior oropharyngeal erythema.  Eyes:     Conjunctiva/sclera: Conjunctivae normal.     Pupils: Pupils are equal, round, and reactive to light.  Cardiovascular:     Rate and Rhythm: Normal rate and regular rhythm.     Heart sounds: Normal heart sounds.  Pulmonary:     Effort: Pulmonary effort is normal. No respiratory distress.     Breath sounds: Rhonchi and rales present.     Comments: Central rhonchi.  Faint rales left base.  No wheeze Abdominal:     General: There is no distension.     Palpations: Abdomen  is soft.  Musculoskeletal:        General: Normal range of motion.     Cervical back: Normal range of motion.  Skin:    General: Skin is warm and dry.  Neurological:     Mental Status: She is alert.     Gait: Gait normal.      UC Treatments / Results  Labs (all labs ordered are listed, but only abnormal results are displayed) Labs Reviewed - No data to display  EKG   Radiology DG Chest 2 View  Result Date: 11/01/2022 CLINICAL DATA:  Provided history: Cough.  Rhonchi. EXAM: CHEST - 2 VIEW COMPARISON:  Prior chest radiographs 05/09/2021 and earlier. FINDINGS: Heart size within normal limits. Small ill-defined opacity within the medial right lung base. No appreciable airspace consolidation on the left. No evidence of pleural effusion or pneumothorax. Levocurvature in spondylosis of the thoracic spine. These results will be called to the ordering clinician or representative by the Radiologist Assistant, and communication documented in the PACS or [employer]. IMPRESSION: Small ill-defined opacity within the medial right lung base, which may reflect atelectasis or early pneumonia. Correlate clinically and consider short-interval radiographic follow-up. Electronically Signed   By: Kellie Simmering D.O.   On: 11/01/2022 10:05    Procedures Procedures (including critical care time)  Medications Ordered in UC Medications - No data to display  Initial Impression / Assessment and Plan / UC Course  I have reviewed the triage vital signs and the nursing notes.  Pertinent labs & imaging results that were available during my care of the patient were reviewed by me and considered in my medical decision making (see chart for details).    Reviewed results with patient.  Emphasized importance of follow-up. Final Clinical Impressions(s) / UC Diagnoses   Final diagnoses:  Community acquired pneumonia of right lower lobe of lung     Discharge Instructions      You have pneumonia.  You  need to take 2 antibiotics.  Take the Augmentin 2 times a day.  Take the Z-Pak as directed.  While you are on strong antibiotics, I recommend you take a probiotic to prevent stomach upset Drink lots of water.  Run a humidifier if you  have 1 available Take Tessalon pills for cough. Get plenty of rest Call on Monday to make an appointment and see your doctor in 3 to 4 weeks.  She will want to reexamine your lungs and maybe get an x-ray to make sure your pneumonia has resolved   ED Prescriptions     Medication Sig Dispense Auth. Provider   amoxicillin-clavulanate (AUGMENTIN) 875-125 MG tablet Take 1 tablet by mouth every 12 (twelve) hours. 14 tablet Raylene Everts, MD   azithromycin (ZITHROMAX Z-PAK) 250 MG tablet Take two pills today followed by one a day until gone 6 tablet Raylene Everts, MD   benzonatate (TESSALON) 200 MG capsule Take 1 capsule (200 mg total) by mouth 3 (three) times daily as needed for cough. 21 capsule Raylene Everts, MD      PDMP not reviewed this encounter.   Raylene Everts, MD 11/01/22 1025

## 2023-04-10 ENCOUNTER — Other Ambulatory Visit: Payer: Self-pay

## 2023-04-10 ENCOUNTER — Ambulatory Visit
Admission: EM | Admit: 2023-04-10 | Discharge: 2023-04-10 | Disposition: A | Discharge status: Home / Self Care | Payer: Medicare Other | Source: Home / Self Care

## 2023-04-10 DIAGNOSIS — N393 Stress incontinence (female) (male): Secondary | ICD-10-CM | POA: Diagnosis not present

## 2023-04-10 DIAGNOSIS — N3001 Acute cystitis with hematuria: Secondary | ICD-10-CM | POA: Insufficient documentation

## 2023-04-10 DIAGNOSIS — R300 Dysuria: Secondary | ICD-10-CM | POA: Diagnosis not present

## 2023-04-10 LAB — POCT URINALYSIS DIP (MANUAL ENTRY)
Bilirubin, UA: NEGATIVE
Glucose, UA: NEGATIVE mg/dL
Ketones, POC UA: NEGATIVE mg/dL
Nitrite, UA: NEGATIVE
Protein Ur, POC: 30 mg/dL — AB
Spec Grav, UA: 1.02 (ref 1.010–1.025)
Urobilinogen, UA: 0.2 E.U./dL
pH, UA: 7 (ref 5.0–8.0)

## 2023-04-10 MED ORDER — SULFAMETHOXAZOLE-TRIMETHOPRIM 800-160 MG PO TABS: 1.0000 | ORAL_TABLET | Freq: Two times a day (BID) | ORAL | 0 refills | Status: AC

## 2023-04-10 NOTE — ED Provider Notes (Signed)
[facility]    CSN: [id number] Arrival date & time: 04/10/23  1117      History   Chief Complaint Chief Complaint  Patient presents with   urinary pressure    Dysuria    HPI Danielle Parsons is a 77 y.o. female.   HPI 77 year old female presents with urinary pressure and dysuria for 1 week. PMH significant for history of bladder infections, female stress incontinence, and osteopenia of multiple sites.  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      Home Medications    Prior to Admission medications   Medication Sig Start Date End Date Taking? Authorizing Provider  sulfamethoxazole-trimethoprim (BACTRIM DS) 800-160 MG tablet Take 1 tablet by mouth 2 (two) times daily for 7 days. 04/10/23 04/17/23 Yes Trevor Iha, FNP  venlafaxine (EFFEXOR) 75 MG tablet Take 75 mg by mouth 2 (two) times daily.    [provider]    Family History Family History  Problem Relation Age of Onset   Alzheimer's disease Mother    Multiple myeloma Father     Social History Social History   Tobacco Use   Smoking status: Never   Smokeless tobacco: Never  Vaping Use   Vaping status: Never Used  Substance Use Topics   Alcohol use: No   Drug use: No     Allergies   Patient has no known allergies.   Review of Systems Review of Systems   Physical Exam Triage Vital Signs ED Triage Vitals  Encounter Vitals Group     BP      Systolic BP Percentile      Diastolic  BP Percentile      Pulse      Resp      Temp      Temp src      SpO2      Weight      Height      Head Circumference      Peak Flow      Pain Score      Pain Loc      Pain Education      Exclude from Growth Chart    No data found.  Updated Vital Signs BP 122/74   Pulse 70   Temp 98.1 F (36.7 C)   Resp 16   SpO2 98%    Physical Exam Vitals and nursing note reviewed.  Constitutional:      Appearance: Normal appearance. She is normal weight.  HENT:     Head: Normocephalic and atraumatic.     Mouth/Throat:     Mouth: Mucous membranes are moist.     Pharynx: Oropharynx is clear.  Eyes:     Extraocular Movements: Extraocular movements intact.     Conjunctiva/sclera: Conjunctivae normal.     Pupils: Pupils are equal, round, and reactive to light.  Cardiovascular:     Rate and Rhythm: Normal rate and regular rhythm.  Pulses: Normal pulses.     Heart sounds: Normal heart sounds.  Pulmonary:     Effort: Pulmonary effort is normal.     Breath sounds: Normal breath sounds. No wheezing, rhonchi or rales.  Abdominal:     Tenderness: There is no right CVA tenderness or left CVA tenderness.  Musculoskeletal:        General: Normal range of motion.     Cervical back: Normal range of motion and neck supple.  Skin:    General: Skin is warm and dry.  Neurological:     General: No focal deficit present.     Mental Status: She is alert and oriented to person, place, and time.  Psychiatric:        Mood and Affect: Mood normal.        Behavior: Behavior normal.        Thought Content: Thought content normal.      UC Treatments / Results  Labs (all labs ordered are listed, but only abnormal results are displayed) Labs Reviewed  POCT URINALYSIS DIP (MANUAL ENTRY) - Abnormal; Notable for the following components:      Result Value   Clarity, UA cloudy (*)    Blood, UA moderate (*)    Protein Ur, POC =30 (*)    Leukocytes, UA Large (3+) (*)    All other components  within normal limits  URINE CULTURE    EKG   Radiology No results found.  Procedures Procedures (including critical care time)  Medications Ordered in UC Medications - No data to display  Initial Impression / Assessment and Plan / UC Course  I have reviewed the triage vital signs and the nursing notes.  Pertinent labs & imaging results that were available during my care of the patient were reviewed by me and considered in my medical decision making (see chart for details).     MDM: 1.  Acute cystitis with hematuria-Rx Bactrim DS 800/160 mg tablet twice daily x 7 days; 2. Dysuria-UA revealed above, urine culture ordered. Advised patient to take medication as directed with food to completion.  Encouraged increase daily water intake to 64 ounces per day while taking this medication.  Advised we will follow-up with urine culture results once received.  Advised if symptoms worsen and/or unresolved please follow-up with PCP, urology, or here for further evaluation.  Patient discharged home, hemodynamically stable. Final Clinical Impressions(s) / UC Diagnoses   Final diagnoses:  Dysuria  Acute cystitis with hematuria     Discharge Instructions      Advised patient to take medication as directed with food to completion.  Encouraged increase daily water intake to 64 ounces per day while taking this medication.  Advised we will follow-up with urine culture results once received.  Advised if symptoms worsen and/or unresolved please follow-up with PCP, urology, or here for further evaluation.     ED Prescriptions     Medication Sig Dispense Auth. Provider   sulfamethoxazole-trimethoprim (BACTRIM DS) 800-160 MG tablet Take 1 tablet by mouth 2 (two) times daily for 7 days. 14 tablet Trevor Iha, FNP      PDMP not reviewed this encounter.   Trevor Iha, FNP 04/10/23 1145

## 2023-04-10 NOTE — Discharge Instructions (Addendum)
Advised patient to take medication as directed with food to completion.  Encouraged increase daily water intake to 64 ounces per day while taking this medication.  Advised we will follow-up with urine culture results once received.  Advised if symptoms worsen and/or unresolved please follow-up with PCP, urology, or here for further evaluation.

## 2023-04-10 NOTE — ED Triage Notes (Signed)
Pt presents to uc with co of  urinary pressure and burning for one week.

## 2023-04-12 LAB — URINE CULTURE: Culture: 20000 — AB

## 2023-10-26 ENCOUNTER — Other Ambulatory Visit: Payer: Self-pay

## 2023-10-26 ENCOUNTER — Ambulatory Visit (INDEPENDENT_AMBULATORY_CARE_PROVIDER_SITE_OTHER): Payer: Medicare Other

## 2023-10-26 ENCOUNTER — Ambulatory Visit
Admission: EM | Admit: 2023-10-26 | Discharge: 2023-10-26 | Disposition: A | Discharge status: Home / Self Care | Payer: Medicare Other | Attending: Family Medicine | Admitting: Family Medicine

## 2023-10-26 DIAGNOSIS — R051 Acute cough: Secondary | ICD-10-CM | POA: Diagnosis not present

## 2023-10-26 DIAGNOSIS — R059 Cough, unspecified: Secondary | ICD-10-CM | POA: Diagnosis not present

## 2023-10-26 DIAGNOSIS — J101 Influenza due to other identified influenza virus with other respiratory manifestations: Secondary | ICD-10-CM

## 2023-10-26 LAB — POCT INFLUENZA A/B
Influenza A, POC: POSITIVE — AB
Influenza B, POC: NEGATIVE

## 2023-10-26 MED ORDER — OSELTAMIVIR PHOSPHATE 75 MG PO CAPS: 75.0000 mg | ORAL_CAPSULE | Freq: Two times a day (BID) | ORAL | 0 refills | Status: DC

## 2023-10-26 NOTE — Discharge Instructions (Addendum)
Take Tamiflu 1 pill every 12 hours for 5 days This will help with your flu symptoms Drink lots of water May take over-the-counter cough or cold medicines as needed.  Take Tylenol or ibuprofen for pain and fever

## 2023-10-26 NOTE — ED Triage Notes (Signed)
Pt c/o cough since Sunday evening. Fatigue. Back pain starting this morning. COVID and Flu neg at home on Sunday. Hx of pneumonia. Taking Mucinex severe congestion, tylenol and robitussin prn.

## 2023-10-26 NOTE — ED Provider Notes (Signed)
[facility]    CSN: [id number] Arrival date & time: 10/26/23  1324      History   Chief Complaint Chief Complaint  Patient presents with   Cough   Back Pain    HPI Danielle Parsons is a 78 y.o. female.   Patient has had cough and cold, headache and backache for the last couple of days.  She has been around family members with influenza.  She states she has considerable cough and congestion.  No chest pain or shortness of breath.  Feels very weak.  She is a flu test at home that was negative.  COVID test at home was negative.  The daughter that accompanies her request flu testing be repeated, since she feels her symptoms are consistent with influenza.  Patient was seen last year this time and had community-acquired pneumonia.    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      Home Medications    Prior to Admission medications   Medication Sig Start Date End Date Taking? Authorizing Provider  estradiol (ESTRACE) 0.1 MG/GM vaginal cream Place vaginally. 05/06/23 05/05/24 Yes [provider]  oseltamivir (TAMIFLU) 75 MG capsule Take 1 capsule (75 mg total) by mouth every 12 (twelve) hours. 10/26/23  Yes Eustace Moore, MD  rosuvastatin (CRESTOR) 5 MG tablet Take 5 mg by mouth daily.    [provider]  tiZANidine (ZANAFLEX) 4 MG tablet Take 4 mg by mouth 3 (three) times daily.    [provider]  trimethoprim (TRIMPEX) 100 MG tablet Take 100 mg  by mouth at bedtime.    [provider]  venlafaxine (EFFEXOR) 75 MG tablet Take 75 mg by mouth 2 (two) times daily.    [provider]    Family History Family History  Problem Relation Age of Onset   Alzheimer's disease Mother    Multiple myeloma Father     Social History Social History   Tobacco Use   Smoking status: Never   Smokeless tobacco: Never  Vaping Use   Vaping status: Never Used  Substance Use Topics   Alcohol use: No   Drug use: No     Allergies   Patient has no known allergies.   Review of Systems Review of Systems See HPI  Physical Exam Triage Vital Signs ED Triage Vitals  Encounter Vitals Group     BP 10/26/23 0829 104/69     Systolic BP Percentile --      Diastolic BP Percentile --      Pulse Rate 10/26/23 0829 80     Resp 10/26/23 0829 17     Temp 10/26/23 0829 99.1 F (37.3 C)     Temp Source 10/26/23 0829 Oral     SpO2 10/26/23 0829 96 %     Weight --      Height --      Head Circumference --      Peak Flow --  Pain Score 10/26/23 0830 0     Pain Loc --      Pain Education --      Exclude from Growth Chart --    No data found.  Updated Vital Signs BP 104/69 (BP Location: Left Arm)   Pulse 80   Temp 99.1 F (37.3 C) (Oral)   Resp 17   SpO2 96%      Physical Exam Constitutional:      General: She is not in acute distress.    Appearance: She is well-developed. She is ill-appearing.  HENT:     Head: Normocephalic and atraumatic.     Mouth/Throat:     Mouth: Mucous membranes are moist.  Eyes:     Conjunctiva/sclera: Conjunctivae normal.     Pupils: Pupils are equal, round, and reactive to light.  Cardiovascular:     Rate and Rhythm: Normal rate.     Heart sounds: Normal heart sounds.  Pulmonary:     Effort: Pulmonary effort is normal.     Breath sounds: Rhonchi present. No wheezing or rales.     Comments: Harsh cough.  Few central rhonchi Abdominal:     General: There is no distension.      Palpations: Abdomen is soft.  Musculoskeletal:        General: Normal range of motion.     Cervical back: Normal range of motion.  Skin:    General: Skin is warm and dry.  Neurological:     Mental Status: She is alert.      UC Treatments / Results  Labs (all labs ordered are listed, but only abnormal results are displayed) Labs Reviewed  POCT INFLUENZA A/B - Abnormal; Notable for the following components:      Result Value   Influenza A, POC Positive (*)    All other components within normal limits    EKG   Radiology DG Chest 2 View Result Date: 10/26/2023 CLINICAL DATA:  Cough and congestion EXAM: CHEST - 2 VIEW COMPARISON:  X-ray 11/01/2022 and older FINDINGS: No consolidation, pneumothorax or effusion. No edema. Normal cardiopericardial silhouette. Curvature of the spine with moderate degenerative changes. Slight pectus excavatum. IMPRESSION: No acute cardiopulmonary disease. Electronically Signed   By: Karen Kays M.D.   On: 10/26/2023 10:54    Procedures Procedures (including critical care time)  Medications Ordered in UC Medications - No data to display  Initial Impression / Assessment and Plan / UC Course  I have reviewed the triage vital signs and the nursing notes.  Pertinent labs & imaging results that were available during my care of the patient were reviewed by me and considered in my medical decision making (see chart for details).     Patient is discharged prior to x-ray being read.  I did review the x-ray and do not see any known your consolidation.  Patient will be called with any x-ray findings are significant.  CXR is as expected.  NO call indicated Final Clinical Impressions(s) / UC Diagnoses   Final diagnoses:  Acute cough  Influenza A     Discharge Instructions      Take Tamiflu 1 pill every 12 hours for 5 days This will help with your flu symptoms Drink lots of water May take over-the-counter cough or cold medicines as needed.  Take  Tylenol or ibuprofen for pain and fever    ED Prescriptions     Medication Sig Dispense Auth. Provider   oseltamivir (TAMIFLU) 75 MG capsule Take 1 capsule (  75 mg total) by mouth every 12 (twelve) hours. 10 capsule Eustace Moore, MD      PDMP not reviewed this encounter.   Eustace Moore, MD 10/26/23 [phone]

## 2024-01-24 ENCOUNTER — Ambulatory Visit: Admission: EM | Admit: 2024-01-24 | Discharge: 2024-01-24 | Disposition: A | Discharge status: Home / Self Care

## 2024-01-24 DIAGNOSIS — H6123 Impacted cerumen, bilateral: Secondary | ICD-10-CM

## 2024-01-24 NOTE — ED Triage Notes (Signed)
 Pt c/o LT ear pain since Fri.

## 2024-01-24 NOTE — ED Provider Notes (Signed)
 [facility]    CSN: [id number] Arrival date & time: 01/24/24  1440      History   Chief Complaint Chief Complaint  Patient presents with   Otalgia    LT    HPI Ceona Contorno is a 78 y.o. female.   HPI present 79 year old female presents with left otalgia for 2 days.  PMH significant for DJD of right knee and osteopenia of multiple sites.  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      Home Medications    Prior to Admission medications   Medication Sig Start Date End Date Taking? Authorizing Provider  estradiol (ESTRACE) 0.1 MG/GM vaginal cream Place vaginally. 05/06/23 05/05/24  [provider]  rosuvastatin (CRESTOR) 5 MG tablet Take 5 mg by mouth daily.    [provider]  tiZANidine  (ZANAFLEX ) 4 MG tablet Take 4 mg by mouth 3 (three) times daily.    [provider]  trimethoprim  (TRIMPEX ) 100 MG tablet Take 100 mg by mouth at bedtime.    [provider]  venlafaxine (EFFEXOR) 75 MG tablet Take 75 mg by mouth 2 (two) times daily.    [provider]    Family History Family History  Problem Relation Age of Onset   Alzheimer's disease Mother    Multiple myeloma Father     Social History Social History   Tobacco Use   Smoking status: Never   Smokeless tobacco: Never  Vaping Use   Vaping status: Never Used  Substance Use Topics   Alcohol use: No   Drug use: No     Allergies   Patient has no known  allergies.   Review of Systems Review of Systems  HENT:  Positive for ear pain.   All other systems reviewed and are negative.    Physical Exam Triage Vital Signs ED Triage Vitals  Encounter Vitals Group     BP      Systolic BP Percentile      Diastolic BP Percentile      Pulse      Resp      Temp      Temp src      SpO2      Weight      Height      Head Circumference      Peak Flow      Pain Score      Pain Loc      Pain Education      Exclude from Growth Chart    No data found.  Updated Vital Signs BP 120/71 (BP Location: Right Arm)   Pulse 84   Temp 97.9 F (36.6 C) (Oral)   Resp 17   SpO2 97%    Physical Exam Vitals and nursing note reviewed.  Constitutional:      Appearance: Normal appearance. She is normal weight.  HENT:     Head: Normocephalic and atraumatic.     Right  Ear: External ear normal.     Left Ear: External ear normal.     Ears:     Comments: Bilateral EACs with excessive cerumen unable to visualize either TM.  Post bilateral ear lavage: Left EAC-ear, Left TM-clear, good light reflex with landmarks noted; Right EAC-clear, Right TM-clear, good light reflex with landmarks noted    Mouth/Throat:     Mouth: Mucous membranes are moist.     Pharynx: Oropharynx is clear.  Eyes:     Extraocular Movements: Extraocular movements intact.     Conjunctiva/sclera: Conjunctivae normal.     Pupils: Pupils are equal, round, and reactive to light.  Cardiovascular:     Rate and Rhythm: Normal rate and regular rhythm.     Pulses: Normal pulses.     Heart sounds: Normal heart sounds.  Pulmonary:     Effort: Pulmonary effort is normal.     Breath sounds: Normal breath sounds. No wheezing, rhonchi or rales.  Musculoskeletal:        General: Normal range of motion.     Cervical back: Normal range of motion and neck supple.  Skin:    General: Skin is warm.  Neurological:     General: No focal deficit present.     Mental Status: She is alert and  oriented to person, place, and time. Mental status is at baseline.  Psychiatric:        Mood and Affect: Mood normal.        Behavior: Behavior normal.      UC Treatments / Results  Labs (all labs ordered are listed, but only abnormal results are displayed) Labs Reviewed - No data to display  EKG   Radiology No results found.  Procedures Procedures (including critical care time)  Medications Ordered in UC Medications - No data to display  Initial Impression / Assessment and Plan / UC Course  I have reviewed the triage vital signs and the nursing notes.  Pertinent labs & imaging results that were available during my care of the patient were reviewed by me and considered in my medical decision making (see chart for details).     MDM: 1.  Bilateral impacted cerumen-resolved with bilateral ear lavage. Advised if symptoms worsen and/or unresolved please follow-up with your PCP or here for further evaluation.  Patient discharged home, hemodynamically stable. Final Clinical Impressions(s) / UC Diagnoses   Final diagnoses:  Bilateral impacted cerumen     Discharge Instructions      Advised if symptoms worsen and/or unresolved please follow-up with your PCP or here for further evaluation.   ED Prescriptions   None    PDMP not reviewed this encounter.   Leonides Ramp, FNP 01/24/24 1531

## 2024-01-24 NOTE — Discharge Instructions (Addendum)
 Advised if symptoms worsen and/or unresolved please follow-up with your PCP or here for further evaluation.

## 2024-02-11 ENCOUNTER — Ambulatory Visit
Admission: EM | Admit: 2024-02-11 | Discharge: 2024-02-11 | Disposition: A | Discharge status: Home / Self Care | Attending: Family Medicine | Admitting: Family Medicine

## 2024-02-11 DIAGNOSIS — N3001 Acute cystitis with hematuria: Secondary | ICD-10-CM | POA: Insufficient documentation

## 2024-02-11 LAB — POCT URINALYSIS DIP (MANUAL ENTRY)
Bilirubin, UA: NEGATIVE
Glucose, UA: NEGATIVE mg/dL
Ketones, POC UA: NEGATIVE mg/dL
Nitrite, UA: NEGATIVE
Protein Ur, POC: 30 mg/dL — AB
Spec Grav, UA: 1.025 (ref 1.010–1.025)
Urobilinogen, UA: 0.2 U/dL
pH, UA: 6 (ref 5.0–8.0)

## 2024-02-11 MED ORDER — CEFDINIR 300 MG PO CAPS: 300.0000 mg | ORAL_CAPSULE | Freq: Two times a day (BID) | ORAL | 0 refills | Status: AC

## 2024-02-11 NOTE — ED Triage Notes (Signed)
 Pt c/o bladder pressure x 1 week or more. Has called urologist but didn't have availability.

## 2024-02-11 NOTE — Discharge Instructions (Addendum)
 Advised patient to take medication as directed with food to completion.  Encouraged to increase daily water intake to 64 ounces per day while taking this medication.  Advised we will follow-up with urine culture results once received.  Advised if symptoms worsen and/or unresolved please follow-up with your PCP, urologist, or here for further evaluation.

## 2024-02-11 NOTE — ED Provider Notes (Signed)
 [facility]    CSN: [id number] Arrival date & time: 02/11/24  1417      History   Chief Complaint No chief complaint on file.   HPI Danielle Parsons is a 78 y.o. female.   HPI Very pleasant 78 year old female presents with bladder pressure for 1 week.  PMH significant for osteopenia of multiple sites and history of bladder infections.  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      Home Medications    Prior to Admission medications   Medication Sig Start Date End Date Taking? Authorizing Provider  cefdinir (OMNICEF) 300 MG capsule Take 1 capsule (300 mg total) by mouth 2 (two) times daily for 7 days. 02/11/24 02/18/24 Yes Leonides Ramp, FNP  estradiol (ESTRACE) 0.1 MG/GM vaginal cream Place vaginally. 05/06/23 05/05/24  [provider]  rosuvastatin (CRESTOR) 5 MG tablet Take 5 mg by mouth daily.    [provider]  tiZANidine  (ZANAFLEX ) 4 MG tablet Take 4 mg by mouth 3 (three) times daily.    [provider]  trimethoprim  (TRIMPEX ) 100 MG tablet Take 100 mg by mouth at bedtime.    [provider]  venlafaxine (EFFEXOR) 75 MG tablet Take 75 mg by mouth 2 (two) times daily.    [provider]    Family History Family History  Problem Relation Age of Onset   Alzheimer's disease Mother    Multiple myeloma Father     Social History Social History   Tobacco Use   Smoking status: Never   Smokeless tobacco: Never  Vaping  Use   Vaping status: Never Used  Substance Use Topics   Alcohol use: No   Drug use: No     Allergies   Patient has no known allergies.   Review of Systems Review of Systems  Genitourinary:  Positive for dysuria and frequency.       Bladder pressure for 1 week  All other systems reviewed and are negative.    Physical Exam Triage Vital Signs ED Triage Vitals  Encounter Vitals Group     BP 02/11/24 1425 117/70     Systolic BP Percentile --      Diastolic BP Percentile --      Pulse Rate 02/11/24 1425 74     Resp 02/11/24 1425 17     Temp 02/11/24 1425 97.8 F (36.6 C)     Temp Source 02/11/24 1425 Oral     SpO2 02/11/24 1425 95 %     Weight --      Height --      Head Circumference --      Peak Flow --      Pain Score 02/11/24 1426 0     Pain Loc --      Pain Education --      Exclude from Growth Chart --    No data found.  Updated Vital Signs BP 117/70 (BP Location: Right Arm)  Pulse 74   Temp 97.8 F (36.6 C) (Oral)   Resp 17   SpO2 95%    Physical Exam Vitals and nursing note reviewed.  Constitutional:      Appearance: Normal appearance. She is normal weight.  HENT:     Head: Normocephalic and atraumatic.     Mouth/Throat:     Mouth: Mucous membranes are moist.     Pharynx: Oropharynx is clear.  Eyes:     Extraocular Movements: Extraocular movements intact.     Conjunctiva/sclera: Conjunctivae normal.     Pupils: Pupils are equal, round, and reactive to light.  Cardiovascular:     Rate and Rhythm: Normal rate and regular rhythm.     Pulses: Normal pulses.     Heart sounds: Normal heart sounds.  Pulmonary:     Effort: Pulmonary effort is normal.     Breath sounds: Normal breath sounds. No wheezing, rhonchi or rales.  Musculoskeletal:        General: Normal range of motion.     Cervical back: Normal range of motion and neck supple.  Skin:    General: Skin is warm and dry.  Neurological:     General: No focal deficit present.     Mental  Status: She is alert and oriented to person, place, and time. Mental status is at baseline.  Psychiatric:        Mood and Affect: Mood normal.        Behavior: Behavior normal.      UC Treatments / Results  Labs (all labs ordered are listed, but only abnormal results are displayed) Labs Reviewed  POCT URINALYSIS DIP (MANUAL ENTRY) - Abnormal; Notable for the following components:      Result Value   Clarity, UA cloudy (*)    Blood, UA trace-intact (*)    Protein Ur, POC =30 (*)    Leukocytes, UA Moderate (2+) (*)    All other components within normal limits  URINE CULTURE    EKG   Radiology No results found.  Procedures Procedures (including critical care time)  Medications Ordered in UC Medications - No data to display  Initial Impression / Assessment and Plan / UC Course  I have reviewed the triage vital signs and the nursing notes.  Pertinent labs & imaging results that were available during my care of the patient were reviewed by me and considered in my medical decision making (see chart for details).     MDM: 1.  Acute cystitis with hematuria-UA revealed above, urine culture ordered, Rx'd cefdinir 300 mg capsule: Take 1 capsule twice daily x 7 days. Advised patient to take medication as directed with food to completion.  Encouraged to increase daily water intake to 64 ounces per day while taking this medication.  Advised we will follow-up with urine culture results once received.  Advised if symptoms worsen and/or unresolved please follow-up with your PCP, urologist, or here for further evaluation.  Patient discharged home, hemodynamically stable.  Discharged home, hemodynamically stable. Final Clinical Impressions(s) / UC Diagnoses   Final diagnoses:  Acute cystitis with hematuria     Discharge Instructions      Advised patient to take medication as directed with food to completion.  Encouraged to increase daily water intake to 64 ounces per day while taking  this medication.  Advised we will follow-up with urine culture results once received.  Advised if symptoms worsen and/or unresolved please follow-up with your PCP, urologist, or here for further evaluation.  ED Prescriptions     Medication Sig Dispense Auth. Provider   cefdinir (OMNICEF) 300 MG capsule Take 1 capsule (300 mg total) by mouth 2 (two) times daily for 7 days. 14 capsule Jermiah Howton, FNP      PDMP not reviewed this encounter.   Leonides Ramp, FNP 02/11/24 1524

## 2024-02-13 LAB — URINE CULTURE: Culture: 70000 — AB

## 2024-02-14 ENCOUNTER — Ambulatory Visit (HOSPITAL_COMMUNITY): Payer: Self-pay

## 2024-02-21 ENCOUNTER — Ambulatory Visit
Admission: EM | Admit: 2024-02-21 | Discharge: 2024-02-21 | Disposition: A | Discharge status: Home / Self Care | Attending: Family Medicine | Admitting: Family Medicine

## 2024-02-21 DIAGNOSIS — R300 Dysuria: Secondary | ICD-10-CM | POA: Diagnosis present

## 2024-02-21 DIAGNOSIS — N3001 Acute cystitis with hematuria: Secondary | ICD-10-CM | POA: Diagnosis not present

## 2024-02-21 LAB — POCT URINALYSIS DIP (MANUAL ENTRY)
Bilirubin, UA: NEGATIVE
Glucose, UA: NEGATIVE mg/dL
Nitrite, UA: NEGATIVE
Protein Ur, POC: 30 mg/dL — AB
Spec Grav, UA: >=1.03 — AB (ref 1.010–1.025)
Urobilinogen, UA: 0.2 U/dL
pH, UA: 5.5 (ref 5.0–8.0)

## 2024-02-21 MED ORDER — CEFDINIR 300 MG PO CAPS: 300.0000 mg | ORAL_CAPSULE | Freq: Two times a day (BID) | ORAL | 0 refills | Status: AC

## 2024-02-21 NOTE — ED Triage Notes (Addendum)
 Pt presents to uc with co of new onset of dysuria and pressure this morning the patient reports recent positive uti and treatment completed on Thursday. Pt reports she does have a urologist who she has an appointment with on 6/27

## 2024-02-21 NOTE — ED Provider Notes (Signed)
 [facility]    CSN: [id number] Arrival date & time: 02/21/24  1556      History   Chief Complaint Chief Complaint  Patient presents with   Dysuria    Pelvic pressure     HPI Danielle Parsons is a 78 y.o. female.   HPI Very pleasant 78 year old female presents with pelvic pressure and dysuria that began earlier this morning.  Patient was evaluated here for UTI on 02/11/2024 please see epic for that encounter note.  PMH significant for frequent bladder infections, female stress incontinence, and osteopenia of multiple sites.  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      Home Medications    Prior to Admission medications   Medication Sig Start Date End Date Taking? Authorizing Provider  cefdinir  (OMNICEF ) 300 MG capsule Take 1 capsule (300 mg total) by mouth 2 (two) times daily for 7 days. 02/21/24 02/28/24 Yes Leonides Ramp, FNP  estradiol (ESTRACE) 0.1 MG/GM vaginal cream Place vaginally. 05/06/23 05/05/24  [provider]  rosuvastatin (CRESTOR) 5 MG tablet Take 5 mg by mouth daily.    [provider]  tiZANidine  (ZANAFLEX ) 4 MG tablet Take 4 mg by mouth 3 (three) times daily.    [provider]  trimethoprim  (TRIMPEX ) 100 MG tablet Take 100 mg by mouth at bedtime.    [provider]  venlafaxine (EFFEXOR) 75 MG tablet Take 75 mg by mouth 2 (two) times daily.    [provider]    Family History Family History  Problem  Relation Age of Onset   Alzheimer's disease Mother    Multiple myeloma Father     Social History Social History   Tobacco Use   Smoking status: Never   Smokeless tobacco: Never  Vaping Use   Vaping status: Never Used  Substance Use Topics   Alcohol use: No   Drug use: No     Allergies   Patient has no known allergies.   Review of Systems Review of Systems  Genitourinary:  Positive for dysuria.       Pelvic pressure since this morning  All other systems reviewed and are negative.    Physical Exam Triage Vital Signs ED Triage Vitals  Encounter Vitals Group     BP      Systolic BP Percentile      Diastolic BP Percentile      Pulse      Resp      Temp      Temp src      SpO2      Weight      Height      Head Circumference      Peak Flow      Pain Score      Pain Loc      Pain Education      Exclude from Growth Chart    No data found.  Updated Vital Signs BP 120/78  Pulse 73   Temp 97.8 F (36.6 C)   Resp 16   SpO2 98%    Physical Exam Vitals and nursing note reviewed.  Constitutional:      Appearance: Normal appearance. She is normal weight.  HENT:     Head: Normocephalic and atraumatic.     Mouth/Throat:     Mouth: Mucous membranes are moist.     Pharynx: Oropharynx is clear.  Eyes:     Extraocular Movements: Extraocular movements intact.     Conjunctiva/sclera: Conjunctivae normal.     Pupils: Pupils are equal, round, and reactive to light.  Cardiovascular:     Rate and Rhythm: Normal rate and regular rhythm.     Pulses: Normal pulses.     Heart sounds: Normal heart sounds.  Pulmonary:     Effort: Pulmonary effort is normal.     Breath sounds: Normal breath sounds. No wheezing, rhonchi or rales.  Abdominal:     Tenderness: There is no right CVA tenderness or left CVA tenderness.  Musculoskeletal:        General: Normal range of motion.     Cervical back: Normal range of motion and neck supple.  Skin:    General: Skin is warm and  dry.  Neurological:     General: No focal deficit present.     Mental Status: She is alert and oriented to person, place, and time. Mental status is at baseline.  Psychiatric:        Mood and Affect: Mood normal.        Behavior: Behavior normal.      UC Treatments / Results  Labs (all labs ordered are listed, but only abnormal results are displayed) Labs Reviewed  POCT URINALYSIS DIP (MANUAL ENTRY) - Abnormal; Notable for the following components:      Result Value   Ketones, POC UA trace (5) (*)    Spec Grav, UA >=1.030 (*)    Blood, UA trace-intact (*)    Protein Ur, POC =30 (*)    Leukocytes, UA Moderate (2+) (*)    All other components within normal limits  URINE CULTURE    EKG   Radiology No results found.  Procedures Procedures (including critical care time)  Medications Ordered in UC Medications - No data to display  Initial Impression / Assessment and Plan / UC Course  I have reviewed the triage vital signs and the nursing notes.  Pertinent labs & imaging results that were available during my care of the patient were reviewed by me and considered in my medical decision making (see chart for details).     MDM: 1.  Acute cystitis with hematuria-UA revealed above, Rx'd cefdinir  300 mg capsule: Take 1 capsule twice daily x 7 days, urine culture ordered. Advised patient to take medication as directed with food to completion.  Advised we will follow-up with urine culture results once received.  Advised increase in daily water intake to 64 ounces per day 7 days/week.  Advised if symptoms worsen and/or unresolved please follow-up with your PCP, urologist, or here for further evaluation.  Final Clinical Impressions(s) / UC Diagnoses   Final diagnoses:  Acute cystitis with hematuria     Discharge Instructions      Advised patient to take medication as directed with food to completion.  Advised we will follow-up with urine culture results once received.  Advised  increase in daily water intake to 64 ounces per day 7 days/week.  Advised if symptoms worsen and/or unresolved  please follow-up with your PCP, urologist, or here for further evaluation.   ED Prescriptions     Medication Sig Dispense Auth. Provider   cefdinir  (OMNICEF ) 300 MG capsule Take 1 capsule (300 mg total) by mouth 2 (two) times daily for 7 days. 14 capsule Haskell Rihn, FNP      PDMP not reviewed this encounter.   Leonides Ramp, FNP 02/21/24 [phone]

## 2024-02-21 NOTE — Discharge Instructions (Addendum)
 Advised patient to take medication as directed with food to completion.  Advised we will follow-up with urine culture results once received.  Advised increase in daily water intake to 64 ounces per day 7 days/week.  Advised if symptoms worsen and/or unresolved please follow-up with your PCP, urologist, or here for further evaluation.

## 2024-02-23 ENCOUNTER — Ambulatory Visit (HOSPITAL_COMMUNITY): Payer: Self-pay

## 2024-02-23 LAB — URINE CULTURE: Culture: 60000 — AB

## 2024-03-07 ENCOUNTER — Ambulatory Visit: Admitting: Family Medicine

## 2024-03-07 ENCOUNTER — Encounter: Payer: Self-pay | Admitting: Urgent Care

## 2024-03-07 ENCOUNTER — Ambulatory Visit: Admitting: Urgent Care

## 2024-03-07 VITALS — BP 132/81 | HR 76 | Resp 18 | Ht 60.0 in | Wt 145.8 lb

## 2024-03-07 DIAGNOSIS — F411 Generalized anxiety disorder: Secondary | ICD-10-CM

## 2024-03-07 DIAGNOSIS — M8589 Other specified disorders of bone density and structure, multiple sites: Secondary | ICD-10-CM | POA: Diagnosis not present

## 2024-03-07 DIAGNOSIS — N952 Postmenopausal atrophic vaginitis: Secondary | ICD-10-CM | POA: Diagnosis not present

## 2024-03-07 MED ORDER — VENLAFAXINE HCL ER 150 MG PO CP24: 150.0000 mg | ORAL_CAPSULE | Freq: Every day | ORAL | 2 refills | Status: AC

## 2024-03-07 NOTE — Progress Notes (Unsigned)
 New Patient Office Visit  Subjective:  Patient ID: Danielle Parsons, female    DOB: [birth year]  Age: 78 y.o. MRN: [id number]  CC:  Chief Complaint  Patient presents with   New Patient (Initial Visit)    Est care    HPI Danielle Parsons presents to establish care.  Discussed the use of AI scribe software for clinical note transcription with the patient, who gave verbal consent to proceed.  History of Present Illness   Danielle Parsons is a 78 year old female who presents to establish care with a new primary care provider.  She takes various vitamins and supplements, including vital collagen powder, which she started two to three years ago for bone health on the recommendation of her son-in-law, a Engineer, civil (consulting). She has noticed improved nail growth and takes the collagen with applesauce every morning without experiencing side effects such as an upset stomach.  She has a history of recurrent bladder infections, having had four this year. She is currently using estradiol vaginal cream and takes trimethoprim  daily as a preventative measure. She experiences pressure during urination but no pain. A post-void residual study a year and a half ago showed she is not retaining urine.  She is on venlafaxine 150 mg daily for anxiety, which was increased from 75 mg after 26 years. She started this medication during menopause due to mood swings and crying spells. She enjoys the increased effectiveness of the increased dose and would like it refilled.  She was previously on Crestor for cholesterol but is no longer taking it. Her current medications include venlafaxine, tizanidine , trimethoprim , and estradiol vaginal cream.  She has a history of osteopenia, but recent bone density tests have been normal. She attributes this improvement to increased physical activity and exercise. Her last bone density test was in June 2023, and she plans to schedule her next test in July 2025.       Outpatient Encounter  Medications as of 03/07/2024  Medication Sig   Albuterol Sulfate (PROAIR RESPICLICK) 108 (90 Base) MCG/ACT AEPB ProAir RespiClick 90 mcg/actuation breath activated  INL 2 PFS PO Q 4 TO 6 H PRN   alendronate (FOSAMAX) 70 MG tablet alendronate 70 mg tablet   estradiol (ESTRACE) 0.1 MG/GM vaginal cream Place vaginally.   rosuvastatin (CRESTOR) 5 MG tablet Take 5 mg by mouth daily.   tiZANidine  (ZANAFLEX ) 4 MG tablet Take 4 mg by mouth 3 (three) times daily.   trimethoprim  (TRIMPEX ) 100 MG tablet Take 100 mg by mouth at bedtime.   venlafaxine XR (EFFEXOR-XR) 150 MG 24 hr capsule Take 1 capsule (150 mg total) by mouth daily with breakfast.   [DISCONTINUED] venlafaxine (EFFEXOR) 75 MG tablet Take 75 mg by mouth 2 (two) times daily.   No facility-administered encounter medications on file as of 03/07/2024.    Past Medical History:  Diagnosis Date   Anxiety    H/O bladder infections     Past Surgical History:  Procedure Laterality Date   CESAREAN SECTION  1979    Family History  Problem Relation Age of Onset   Alzheimer's disease Mother    Multiple myeloma Father     Social History   Socioeconomic History   Marital status: Widowed    Spouse name: Not on file   Number of children: Not on file   Years of education: Not on file   Highest education level: Not on file  Occupational History   Not on file  Tobacco Use   Smoking status: Never  Smokeless tobacco: Never  Vaping Use   Vaping status: Never Used  Substance and Sexual Activity   Alcohol use: No   Drug use: No   Sexual activity: Not on file  Other Topics Concern   Not on file  Social History Narrative   Not on file   Social Drivers of Health   Financial Resource Strain: Low Risk  (01/11/2023)   Received [facility]   Overall Financial Resource Strain (CARDIA)    Difficulty of Paying Living Expenses: Not hard at all  Food Insecurity: No Food Insecurity (01/11/2023)   Received from [facility]   Hunger  Vital Sign    Within the past 12 months, you worried that your food would run out before you got the money to buy more.: Never true    Within the past 12 months, the food you bought just didn't last and you didn't have money to get more.: Never true  Transportation Needs: No Transportation Needs (01/11/2023)   Received from [facility] - Transportation    Lack of Transportation (Medical): No    Lack of Transportation (Non-Medical): No  Physical Activity: Insufficiently Active (01/11/2023)   Received from [facility]   Exercise Vital Sign    On average, how many days per week do you engage in moderate to strenuous exercise (like a brisk walk)?: 3 days    On average, how many minutes do you engage in exercise at this level?: 20 min  Stress: No Stress Concern Present (01/11/2023)   Received from [facility] - Occupational Stress Questionnaire    Feeling of Stress : Only a little  Social Connections: Socially Integrated (01/11/2023)   Received from [facility]   Social Network    How would you rate your social network (family, work, friends)?: Good participation with social networks  Intimate Partner Violence: Not At Risk (01/11/2023)   Received from [facility]   HITS    Over the last 12 months how often did your partner physically hurt you?: Never    Over the last 12 months how often did your partner insult you or talk down to you?: Never    Over the last 12 months how often did your partner threaten you with physical harm?: Never    Over the last 12 months how often did your partner scream or curse at you?: Never    ROS: as noted in HPI  Objective:  BP 132/81 (BP Location: Left Arm, Patient Position: Sitting)   Pulse 76   Resp 18   Ht 5' (1.524 m)   Wt 145 lb 12 oz (66.1 kg)   SpO2 98%   BMI 28.46 kg/m   Physical Exam Vitals and nursing note reviewed.  Constitutional:      General: She is not in acute distress.     Appearance: Normal appearance. She is not ill-appearing, toxic-appearing or diaphoretic.  HENT:     Head: Normocephalic and atraumatic.     Right Ear: Tympanic membrane, ear canal and external ear normal. There is no impacted cerumen.     Left Ear: Tympanic membrane, ear canal and external ear normal. There is no impacted cerumen.     Nose: Nose normal.     Mouth/Throat:     Mouth: Mucous membranes are moist.     Pharynx: Oropharynx is clear. No oropharyngeal exudate or posterior oropharyngeal erythema.   Eyes:     General: No scleral icterus.  Right eye: No discharge.        Left eye: No discharge.     Extraocular Movements: Extraocular movements intact.     Pupils: Pupils are equal, round, and reactive to light.   Neck:     Thyroid : No thyroid  mass, thyromegaly or thyroid  tenderness.   Cardiovascular:     Rate and Rhythm: Normal rate and regular rhythm.     Pulses: Normal pulses.     Heart sounds: No murmur heard. Pulmonary:     Effort: Pulmonary effort is normal. No respiratory distress.     Breath sounds: Normal breath sounds. No stridor. No wheezing or rhonchi.  Abdominal:     General: Abdomen is flat. Bowel sounds are normal. There is no distension.     Palpations: Abdomen is soft. There is no mass.     Tenderness: There is no abdominal tenderness. There is no guarding.   Musculoskeletal:     Cervical back: Normal range of motion and neck supple. No rigidity or tenderness.     Right lower leg: No edema.     Left lower leg: No edema.  Lymphadenopathy:     Cervical: No cervical adenopathy.   Skin:    General: Skin is warm and dry.     Coloration: Skin is not jaundiced.     Findings: No bruising, erythema or rash.   Neurological:     General: No focal deficit present.     Mental Status: She is alert and oriented to person, place, and time.     Sensory: No sensory deficit.     Motor: No weakness.   Psychiatric:        Mood and Affect: Mood normal.         Behavior: Behavior normal.       Assessment & Plan:  GAD (generalized anxiety disorder) -     Venlafaxine HCl ER; Take 1 capsule (150 mg total) by mouth daily with breakfast.  Dispense: 90 capsule; Refill: 2  Atrophic vaginitis  Osteopenia of multiple sites -     DG Bone Density; Future  Assessment and Plan    Urinary Tract Infections (UTIs) Recurrent UTIs with resistance to trimethoprim /sulfamethoxazole . Discussed need for alternative prophylactic antibiotic due to resistance. - Discuss trimethoprim  resistance with urologist and evaluate necessity of continuing the medication. - Consider alternative prophylactic antibiotic such as cephalexin  if necessary. - Continue increasing water intake to 100 ounces daily.  Anxiety Long-standing anxiety managed with venlafaxine 150 mg daily. Tolerating current dose well. - Refill venlafaxine 150 mg extended-release for 90 days with refills.  Osteopenia Osteopenia diagnosed in 2017 with normal bone density tests in 2019, 2021, and 2023. Plan to repeat DEXA scan in July 2025. - Order DEXA scan after March 13, 2024, to ensure insurance coverage.  General Health Maintenance Establishing care with new primary care provider. Due for annual physical and updated labs in November 2025. Reviewed most recent labs from [facility] in Nov 2024 with normal findings - Schedule annual physical in November 2025. - Perform routine labs in November 2025.  Follow-up Requires follow-up for ongoing management of conditions and routine health maintenance. - Return in November 2025 for annual physical. - Follow up with urologist on Friday for discussion about trimethoprim  resistance.        Return in about 5 months (around 08/07/2024) for Annual Physical.   Benton LITTIE Gave, PA

## 2024-03-07 NOTE — Patient Instructions (Addendum)
 Recent urine culture shows resistance to TMP/SMX. Please discuss if continued trimethoprim  is still indicated, or if a new prescription may be helpful.  You are due for your repeat DEXA scan starting in July.   Return in November for annual physical and fasting labs.

## 2024-03-13 ENCOUNTER — Encounter: Payer: Self-pay | Admitting: Urgent Care

## 2024-03-13 ENCOUNTER — Ambulatory Visit: Admitting: Urgent Care

## 2024-03-13 VITALS — BP 127/76 | HR 73 | Resp 20 | Ht 60.0 in | Wt 149.5 lb

## 2024-03-13 DIAGNOSIS — N390 Urinary tract infection, site not specified: Secondary | ICD-10-CM | POA: Diagnosis not present

## 2024-03-13 DIAGNOSIS — G8929 Other chronic pain: Secondary | ICD-10-CM | POA: Diagnosis not present

## 2024-03-13 DIAGNOSIS — M5442 Lumbago with sciatica, left side: Secondary | ICD-10-CM | POA: Diagnosis not present

## 2024-03-13 MED ORDER — TIZANIDINE HCL 4 MG PO TABS: 4.0000 mg | ORAL_TABLET | Freq: Three times a day (TID) | ORAL | 2 refills | Status: DC

## 2024-03-13 MED ORDER — CEPHALEXIN 250 MG PO CAPS: 250.0000 mg | ORAL_CAPSULE | Freq: Every day | ORAL | 1 refills | Status: DC

## 2024-03-13 NOTE — Patient Instructions (Addendum)
  You can start taking this daily to help prevent UTIs. Stop the trimethoprim , start cephalexin  daily instead.  Use the tizanidine  as needed for back pain.

## 2024-03-13 NOTE — Progress Notes (Signed)
 Established Patient Office Visit  Subjective:  Patient ID: Danielle Parsons, female    DOB: 07/16/1946  Age: 78 y.o. MRN: [id number]  Chief Complaint  Patient presents with   Medical Management of Chronic Issues    Med mangement    HPI  Discussed the use of AI scribe software for clinical note transcription with the patient, who gave verbal consent to proceed.  History of Present Illness   Danielle Parsons is a 78 year old female with chronic recurrent urinary tract infections who presents for further management.  She has experienced three to four bladder infections this year despite being on a medication prescribed by her urologist. Her last urine culture showed resistance to the medication she was taking at the time. She has been using a cranberry supplement, but the specific product contains only 100 mg of cranberry extract. Her past medical workup includes a post-void residual test and an ultrasound, both of which were normal.  She has a history of sciatic nerve pain, which manifests as pain in her back radiating to her groin and left leg, sometimes presenting as a sharp pain in her thigh. She uses tizanidine  as needed for this condition, which she finds effective in alleviating the pain. She has not taken the medication in the past three weeks but uses it when necessary. She also engages in exercises that help manage her symptoms.  In terms of her social history, she drinks about 100 ounces of water daily and tries to limit her chocolate intake, although she enjoys dark chocolate. She does not consume alcohol. She confirms using Estrace vaginal cream.       Patient Active Problem List   Diagnosis Date Noted   Right elbow pain 09/27/2019   Right shoulder pain 09/27/2019   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   Atrophic vaginitis 01/05/2018   Female stress incontinence 01/05/2018   GAD (generalized anxiety disorder) 02/18/2017   Osteopenia of multiple sites 02/18/2017   Cystocele with  rectocele 11/16/2016   Past Medical History:  Diagnosis Date   Anxiety    H/O bladder infections    Past Surgical History:  Procedure Laterality Date   CESAREAN SECTION  1979   Social History   Tobacco Use   Smoking status: Never   Smokeless tobacco: Never  Vaping Use   Vaping status: Never Used  Substance Use Topics   Alcohol use: No   Drug use: No      ROS: as noted in HPI  Objective:     BP 127/76 (BP Location: Left Arm, Patient Position: Sitting, Cuff Size: Normal)   Pulse 73   Resp 20   Ht 5' (1.524 m)   Wt 149 lb 8 oz (67.8 kg)   SpO2 97%   BMI 29.20 kg/m  BP Readings from Last 3 Encounters:  03/13/24 127/76  03/07/24 132/81  02/21/24 120/78   Wt Readings from Last 3 Encounters:  03/13/24 149 lb 8 oz (67.8 kg)  03/07/24 145 lb 12 oz (66.1 kg)  11/01/22 140 lb (63.5 kg)      Physical Exam Vitals and nursing note reviewed.  Constitutional:      General: She is not in acute distress.    Appearance: Normal appearance. She is not ill-appearing, toxic-appearing or diaphoretic.  HENT:     Head: Normocephalic and atraumatic.   Eyes:     General: No scleral icterus.       Right eye: No discharge.        Left  eye: No discharge.     Extraocular Movements: Extraocular movements intact.     Pupils: Pupils are equal, round, and reactive to light.    Cardiovascular:     Rate and Rhythm: Normal rate.  Pulmonary:     Effort: Pulmonary effort is normal. No respiratory distress.   Skin:    General: Skin is warm and dry.     Coloration: Skin is not jaundiced.     Findings: No bruising, erythema or rash.   Neurological:     General: No focal deficit present.     Mental Status: She is alert and oriented to person, place, and time.     Gait: Gait normal.   Psychiatric:        Mood and Affect: Mood normal.        Behavior: Behavior normal.         The 10-year ASCVD risk score (Arnett DK, et al., 2019) is: 17.8%  Assessment & Plan:  Chronic  left-sided low back pain with left-sided sciatica -     tiZANidine  HCl; Take 1 tablet (4 mg total) by mouth 3 (three) times daily.  Dispense: 30 tablet; Refill: 2  Recurrent UTI -     Cephalexin ; Take 1 capsule (250 mg total) by mouth at bedtime.  Dispense: 90 capsule; Refill: 1  Assessment and Plan    Chronic Recurrent Urinary Tract Infections (UTIs) Recurrent UTIs despite prophylaxis; resistance to current medication. Current cranberry supplement less potent than recommended. Adequate hydration. Potential dietary contributors discussed. Further evaluation if infections persist. - Discontinue current cranberry supplement, switch to Cystex (500 mg). - Start Keflex  daily for 90 days. - Monitor for recurrent UTIs, return if infections persist. - Continue Estrace vaginal cream. - Maintain hydration with at least 80 ounces of water daily.  Sciatica Lower back pain radiating to groin and left leg, managed with tizanidine  and exercises. Tizanidine  effective but not used in three weeks. Minimize use to avoid drowsiness. - Refill tizanidine  for use as needed, up to three times daily, minimize use to avoid drowsiness. - Continue exercises to manage symptoms.         No follow-ups on file.   Benton LITTIE Gave, PA

## 2024-05-03 ENCOUNTER — Other Ambulatory Visit

## 2024-05-03 DIAGNOSIS — M8589 Other specified disorders of bone density and structure, multiple sites: Secondary | ICD-10-CM | POA: Diagnosis not present

## 2024-05-03 LAB — HM DEXA SCAN: HM Dexa Scan: NORMAL

## 2024-05-04 ENCOUNTER — Ambulatory Visit: Payer: Self-pay | Admitting: Urgent Care

## 2024-05-10 ENCOUNTER — Encounter: Payer: Self-pay | Admitting: Urgent Care

## 2024-08-07 ENCOUNTER — Encounter: Payer: Self-pay | Admitting: Urgent Care

## 2024-08-07 ENCOUNTER — Ambulatory Visit (INDEPENDENT_AMBULATORY_CARE_PROVIDER_SITE_OTHER): Admitting: Urgent Care

## 2024-08-07 VITALS — BP 115/72 | HR 72 | Ht 60.0 in | Wt 145.0 lb

## 2024-08-07 DIAGNOSIS — M791 Myalgia, unspecified site: Secondary | ICD-10-CM

## 2024-08-07 DIAGNOSIS — Z79899 Other long term (current) drug therapy: Secondary | ICD-10-CM

## 2024-08-07 DIAGNOSIS — R799 Abnormal finding of blood chemistry, unspecified: Secondary | ICD-10-CM

## 2024-08-07 DIAGNOSIS — G8929 Other chronic pain: Secondary | ICD-10-CM

## 2024-08-07 DIAGNOSIS — Z0000 Encounter for general adult medical examination without abnormal findings: Secondary | ICD-10-CM | POA: Diagnosis not present

## 2024-08-07 DIAGNOSIS — H9191 Unspecified hearing loss, right ear: Secondary | ICD-10-CM | POA: Diagnosis not present

## 2024-08-07 DIAGNOSIS — M5442 Lumbago with sciatica, left side: Secondary | ICD-10-CM | POA: Diagnosis not present

## 2024-08-07 MED ORDER — TIZANIDINE HCL 4 MG PO TABS: 4.0000 mg | ORAL_TABLET | Freq: Three times a day (TID) | ORAL | 2 refills | Status: DC

## 2024-08-07 NOTE — Patient Instructions (Addendum)
 We completed your annual physical today.  Please continue all of your current medications. Please consider starting turmeric and fish oil which can help with joint pain.  I have placed a referral to physical therapy to help with your gait. Consider purchasing a TENS unit like the one noted here. It can help with musculoskeletal pain.  Follow up with me in 4 months, sooner if needed

## 2024-08-07 NOTE — Progress Notes (Unsigned)
 Annual Wellness Visit     Patient: Danielle Parsons, Female    DOB: October 18, 1945, 78 y.o.   MRN: [id number]  Subjective  Chief Complaint  Patient presents with   Annual Exam    Left sciatica worse from sit to stand    Danielle Parsons is a 78 y.o. female who presents today for her Annual Wellness Visit. She reports consuming a general diet. Pt walks daily and does exercises at home on Youtube. She generally feels fairly well. She reports sleeping fairly well. She does have additional problems to discuss today.   HPI  Vision:Within last year and Dental: No current dental problems and Receives regular dental care   Patient Active Problem List   Diagnosis Date Noted   Right elbow pain 09/27/2019   Right shoulder pain 09/27/2019   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   Atrophic vaginitis 01/05/2018   Female stress incontinence 01/05/2018   GAD (generalized anxiety disorder) 02/18/2017   Osteopenia of multiple sites 02/18/2017   Cystocele with rectocele 11/16/2016   Past Medical History:  Diagnosis Date   Anxiety    H/O bladder infections    Past Surgical History:  Procedure Laterality Date   CESAREAN SECTION  1979   Social History   Tobacco Use   Smoking status: Never   Smokeless tobacco: Never  Vaping Use   Vaping status: Never Used  Substance Use Topics   Alcohol use: No   Drug use: No      Medications: Outpatient Medications Prior to Visit  Medication Sig   Albuterol Sulfate (PROAIR RESPICLICK) 108 (90 Base) MCG/ACT AEPB ProAir RespiClick 90 mcg/actuation breath activated  INL 2 PFS PO Q 4 TO 6 H PRN   cephALEXin  (KEFLEX ) 250 MG capsule Take 1 capsule (250 mg total) by mouth at bedtime.   COLLAGEN MATRIX, BOVINE, EX Apply topically.   gatifloxacin (ZYMAXID) 0.5 % SOLN SMARTSIG:In Eye(s)   influenza vac split trivalent (AFLURIA) injection See admin instructions.   Multiple Vitamins-Minerals (PRESERVISION AREDS PO) Take by mouth.   Na Sulfate-K Sulfate-Mg Sulfate  concentrate (SUPREP) 17.5-3.13-1.6 GM/177ML SOLN    rosuvastatin (CRESTOR) 5 MG tablet Take 5 mg by mouth daily.   venlafaxine  XR (EFFEXOR -XR) 150 MG 24 hr capsule Take 1 capsule (150 mg total) by mouth daily with breakfast.   [DISCONTINUED] alendronate (FOSAMAX) 70 MG tablet alendronate 70 mg tablet   [DISCONTINUED] tiZANidine  (ZANAFLEX ) 4 MG tablet Take 1 tablet (4 mg total) by mouth 3 (three) times daily.   No facility-administered medications prior to visit.    No Known Allergies  Patient Care Team: Lowella Benton CROME, [place] as PCP - General (Physician Assistant)  ROS Complete 12 point ROS performed with all pertinent positives listed in HPI     Objective  BP 115/72   Pulse 72   Ht 5' (1.524 m)   Wt 145 lb (65.8 kg)   SpO2 97%   BMI 28.32 kg/m  BP Readings from Last 3 Encounters:  08/07/24 115/72  03/13/24 127/76  03/07/24 132/81   Wt Readings from Last 3 Encounters:  08/07/24 145 lb (65.8 kg)  03/13/24 149 lb 8 oz (67.8 kg)  03/07/24 145 lb 12 oz (66.1 kg)      Physical Exam Vitals and nursing note reviewed.  Constitutional:      General: She is not in acute distress.    Appearance: Normal appearance. She is not ill-appearing, toxic-appearing or diaphoretic.  HENT:     Head: Normocephalic and atraumatic.  Right Ear: Tympanic membrane, ear canal and external ear normal. There is no impacted cerumen.     Left Ear: Tympanic membrane, ear canal and external ear normal. There is no impacted cerumen.     Nose: Nose normal.     Mouth/Throat:     Mouth: Mucous membranes are moist.     Pharynx: Oropharynx is clear. No oropharyngeal exudate or posterior oropharyngeal erythema.  Eyes:     General: No scleral icterus.       Right eye: No discharge.        Left eye: No discharge.     Extraocular Movements: Extraocular movements intact.     Pupils: Pupils are equal, round, and reactive to light.  Neck:     Thyroid : No thyroid  mass, thyromegaly or thyroid  tenderness.   Cardiovascular:     Rate and Rhythm: Normal rate and regular rhythm.     Pulses: Normal pulses.     Heart sounds: No murmur heard. Pulmonary:     Effort: Pulmonary effort is normal. No respiratory distress.     Breath sounds: Normal breath sounds. No stridor. No wheezing or rhonchi.  Abdominal:     General: Abdomen is flat. Bowel sounds are normal. There is no distension.     Palpations: Abdomen is soft. There is no mass.     Tenderness: There is no abdominal tenderness. There is no guarding.  Musculoskeletal:     Cervical back: Normal range of motion and neck supple. No rigidity or tenderness.     Right lower leg: No edema.     Left lower leg: No edema.  Lymphadenopathy:     Cervical: No cervical adenopathy.  Skin:    General: Skin is warm and dry.     Coloration: Skin is not jaundiced.     Findings: No bruising, erythema or rash.  Neurological:     General: No focal deficit present.     Mental Status: She is alert and oriented to person, place, and time.     Sensory: No sensory deficit.     Motor: No weakness.  Psychiatric:        Mood and Affect: Mood normal.        Behavior: Behavior normal.     Most recent depression screenings:    03/07/2024   10:47 AM  PHQ 2/9 Scores  PHQ - 2 Score 2  PHQ- 9 Score 11      Data saved with a previous flowsheet row definition    Fall Screening Falls in the past year?: 0 Number of falls in past year: 0 Was there an injury with Fall?: 0 Fall Risk Category Calculator: 0 Patient Fall Risk Level: Low Fall Risk  Fall Risk Patient at Risk for Falls Due to: Impaired balance/gait Fall risk Follow up: Falls evaluation completed    Vision/Hearing Screen: No results found.  Last CBC No results found for: WBC, HGB, HCT, MCV, MCH, RDW, PLT Last metabolic panel No results found for: GLUCOSE, NA, K, CL, CO2, BUN, CREATININE, EGFR, CALCIUM, PHOS, PROT, ALBUMIN, LABGLOB, AGRATIO, BILITOT,  ALKPHOS, AST, ALT, ANIONGAP Last lipids No results found for: CHOL, HDL, LDLCALC, LDLDIRECT, TRIG, CHOLHDL Last hemoglobin A1c No results found for: HGBA1C Last thyroid  functions No results found for: TSH, T3TOTAL, T4TOTAL, FREET4, THYROIDAB Last vitamin D No results found for: 25OHVITD2, 25OHVITD3, VD25OH Last vitamin B12 and Folate No results found for: VITAMINB12, FOLATE    No results found for any visits on 08/07/24.    Assessment &  Plan   Annual wellness visit done today including the all of the following: Reviewed patient's Family and Medical History Reviewed and updated list of patient's medical providers Assessment of cognitive impairment was done Assessed patient's functional ability Established a written schedule for health screening services Health Risk Assessent Completed and Reviewed  Exercise Activities and Dietary recommendations  Goals   None     Immunization History  Administered Date(s) Administered   Influenza-Unspecified 06/28/2024   PFIZER(Purple Top)SARS-COV-2 Vaccination 10/06/2019, 10/24/2019, 06/10/2020   Pfizer Covid-19 Vaccine Bivalent Booster 4yrs & up 01/07/2022   Tdap 09/12/2020    Health Maintenance  Topic Date Due   Medicare Annual Wellness (AWV)  01/11/2024   COVID-19 Vaccine (6 - 2025-26 season) 08/23/2024 (Originally 05/15/2024)   Hepatitis C Screening  08/07/2025 (Originally 11/20/1965)   DTaP/Tdap/Td (2 - Td or Tdap) 09/12/2030   Pneumococcal Vaccine: 50+ Years  Completed   Influenza Vaccine  Completed   Bone Density Scan  Completed   Zoster Vaccines- Shingrix  Completed   Meningococcal B Vaccine  Aged Out   Mammogram  Discontinued     Discussed health benefits of physical activity, and encouraged her to engage in regular exercise appropriate for her age and condition.    Problem List Items Addressed This Visit   None Visit Diagnoses       Routine adult health maintenance    -   Primary   Relevant Orders   CBC with Differential/Platelet   Hemoglobin A1c   TSH   Lipid panel   Comprehensive metabolic panel with GFR     Myalgia       Relevant Orders   CK (Creatine Kinase)     Chronic left-sided low back pain with left-sided sciatica       Relevant Medications   tiZANidine  (ZANAFLEX ) 4 MG tablet   Other Relevant Orders   Ambulatory referral to Physical Therapy     Hearing loss of right ear, unspecified hearing loss type         Long-term use of high-risk medication       Relevant Orders   CBC with Differential/Platelet     Abnormal finding of blood chemistry, unspecified       Relevant Orders   Hemoglobin A1c       No follow-ups on file.     Benton LITTIE Gave, PA

## 2024-08-08 ENCOUNTER — Ambulatory Visit: Payer: Self-pay | Admitting: Urgent Care

## 2024-08-08 LAB — CBC WITH DIFFERENTIAL/PLATELET
Basophils Absolute: 0 x10E3/uL (ref 0.0–0.2)
Basos: 1 %
EOS (ABSOLUTE): 0.1 x10E3/uL (ref 0.0–0.4)
Eos: 2 %
Hematocrit: 43.4 % (ref 34.0–46.6)
Hemoglobin: 14.3 g/dL (ref 11.1–15.9)
Immature Grans (Abs): 0 x10E3/uL (ref 0.0–0.1)
Immature Granulocytes: 0 %
Lymphocytes Absolute: 1.1 x10E3/uL (ref 0.7–3.1)
Lymphs: 23 %
MCH: 30 pg (ref 26.6–33.0)
MCHC: 32.9 g/dL (ref 31.5–35.7)
MCV: 91 fL (ref 79–97)
Monocytes Absolute: 0.5 x10E3/uL (ref 0.1–0.9)
Monocytes: 10 %
Neutrophils Absolute: 2.9 x10E3/uL (ref 1.4–7.0)
Neutrophils: 64 %
Platelets: 216 x10E3/uL (ref 150–450)
RBC: 4.76 x10E6/uL (ref 3.77–5.28)
RDW: 13.4 % (ref 11.7–15.4)
WBC: 4.6 x10E3/uL (ref 3.4–10.8)

## 2024-08-08 LAB — LIPID PANEL
Chol/HDL Ratio: 2.4 ratio (ref 0.0–4.4)
Cholesterol, Total: 161 mg/dL (ref 100–199)
HDL: 67 mg/dL (ref >39)
LDL Chol Calc (NIH): 81 mg/dL (ref 0–99)
Triglycerides: 69 mg/dL (ref 0–149)
VLDL Cholesterol Cal: 13 mg/dL (ref 5–40)

## 2024-08-08 LAB — TSH: TSH: 2.62 u[IU]/mL (ref 0.450–4.500)

## 2024-08-08 LAB — COMPREHENSIVE METABOLIC PANEL WITH GFR
ALT: 14 IU/L (ref 0–32)
AST: 24 IU/L (ref 0–40)
Albumin: 4.3 g/dL (ref 3.8–4.8)
Alkaline Phosphatase: 72 IU/L (ref 49–135)
BUN/Creatinine Ratio: 24 (ref 12–28)
BUN: 22 mg/dL (ref 8–27)
Bilirubin Total: 0.6 mg/dL (ref 0.0–1.2)
CO2: 20 mmol/L (ref 20–29)
Calcium: 8.9 mg/dL (ref 8.7–10.3)
Chloride: 107 mmol/L — ABNORMAL HIGH (ref 96–106)
Creatinine, Ser: 0.93 mg/dL (ref 0.57–1.00)
Globulin, Total: 2.4 g/dL (ref 1.5–4.5)
Glucose: 102 mg/dL — ABNORMAL HIGH (ref 70–99)
Potassium: 3.9 mmol/L (ref 3.5–5.2)
Sodium: 141 mmol/L (ref 134–144)
Total Protein: 6.7 g/dL (ref 6.0–8.5)
eGFR: 64 mL/min/1.73 (ref >59)

## 2024-08-08 LAB — HEMOGLOBIN A1C
Est. average glucose Bld gHb Est-mCnc: 120 mg/dL
Hgb A1c MFr Bld: 5.8 % — ABNORMAL HIGH (ref 4.8–5.6)

## 2024-08-08 LAB — CK: Total CK: 166 U/L (ref 32–182)

## 2024-08-08 NOTE — Therapy (Unsigned)
 OUTPATIENT PHYSICAL THERAPY THORACOLUMBAR EVALUATION   Patient Name: Danielle Parsons MRN: [id number] DOB:22-Sep-1945, 78 y.o., female Today's Date: 08/09/2024  END OF SESSION:  PT End of Session - 08/09/24 1146     Visit Number 1    Number of Visits 16    Date for Recertification  10/04/24    Authorization Type medicare and [employer] of [place]    Progress Note Due on Visit 10    PT Start Time 0845    PT Stop Time 0930    PT Time Calculation (min) 45 min    Activity Tolerance Patient tolerated treatment well          Past Medical History:  Diagnosis Date   Anxiety    H/O bladder infections    Past Surgical History:  Procedure Laterality Date   CESAREAN SECTION  1979   Patient Active Problem List   Diagnosis Date Noted   Right elbow pain 09/27/2019   Right shoulder pain 09/27/2019   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   Atrophic vaginitis 01/05/2018   Female stress incontinence 01/05/2018   GAD (generalized anxiety disorder) 02/18/2017   Osteopenia of multiple sites 02/18/2017   Cystocele with rectocele 11/16/2016    PCP: Benton LITTIE Quay, PA  REFERRING PROVIDER: Benton LITTIE Quay, PA  REFERRING DIAG: 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   Rationale for Evaluation and Treatment: Rehabilitation  THERAPY DIAG:  Other low back pain  Sciatica, left side  Other symptoms and signs involving the musculoskeletal system  Muscle weakness (generalized)  Other abnormalities of gait and mobility  ONSET DATE: 07/24/24  SUBJECTIVE:                                                                                                                                                                                           SUBJECTIVE STATEMENT: Patient reports that the L LBP and L LE pain for years but it has gotten worse in the past couple of weeks with no known injury. She has had pain over the past two years when walking but the pain resolved with rest. Sometimes feels weak in the L  leg but has not fallen.   PERTINENT HISTORY:  Anxiety; bladder infections; bladder tack 2018; C-section 1979; osteoporosis   PAIN:  Are you having pain? Yes: NPRS scale: 4/10; best in past week 0/10; worst in past week 6/10  Pain location: across back; L leg -posterior to lateral calf and anterior shin  Pain description: sharp; groin  Aggravating factors: sitting; moving from sit to stand  Relieving factors: walking; resting   PRECAUTIONS: None  RED FLAGS: None  WEIGHT BEARING RESTRICTIONS: No  FALLS:  Has patient fallen in last 6 months? No  LIVING ENVIRONMENT: Lives with: lives alone Lives in: House/apartment Stairs: Yes: External: 3  steps; on left going up Has following equipment at home: Single point cane  OCCUPATION: retired from [employer] - retired ~ 10 years ago some sitting at work but some standing Cooking; cleaning; household chores; yard work; walking daily ~ 30 min inclines; recliner   PLOF: Independent  PATIENT GOALS: get rid of pain and walk without limping   NEXT MD VISIT: 12/05/24  OBJECTIVE:  Note: Objective measures were completed at Evaluation unless otherwise noted.  DIAGNOSTIC FINDINGS:  None for back or hip   PATIENT SURVEYS:  Modified Oswestry Low Back Scale - 13/50; 26%  COGNITION: Overall cognitive status: Within functional limits for tasks assessed     SENSATION: Some tingling in L LE   MUSCLE LENGTH: Hamstrings: Right 65 deg; Left 60 deg Hip flexor tightness: Right -5 to -10 deg; Left -10 -15 deg  POSTURE: rounded shoulders, forward head, decreased lumbar lordosis, increased thoracic kyphosis, flexed trunk , and weight shift right  PALPATION: Significant tightness in L >> R hip flexors (iliacus and psoas); proximal quads; posterior hip through the piriformis; glut med/min; bilat lumbar paraspinals   LUMBAR ROM: pain with lumbar ROM in all planes   AROM eval  Flexion 70%  Extension 40%  Right lateral flexion 30%  Left lateral  flexion 40%  Right rotation 20%  Left rotation 25%   (Blank rows = not tested)  LOWER EXTREMITY ROM:   approximate A-AAROM   Active  Right eval Left eval  Hip flexion 110 105  Hip extension -5 to-10  -10 to -15   Hip abduction    Hip adduction    Hip internal rotation Tight  Tight > than R  Hip external rotation Tight  Tight > than R   Knee flexion WNL's WNL's  Knee extension WNL's  WNL's   Ankle dorsiflexion    Ankle plantarflexion    Ankle inversion    Ankle eversion     (Blank rows = not tested)  LOWER EXTREMITY MMT:  assessed in supine and sidelying   MMT Right eval Left eval  Hip flexion 4/5 Discomfort  Painful AROM  Hip extension    Hip abduction 4 4-  Hip adduction    Hip internal rotation    Hip external rotation    Knee flexion    Knee extension    Ankle dorsiflexion    Ankle plantarflexion    Ankle inversion    Ankle eversion     (Blank rows = not tested)  LUMBAR SPECIAL TESTS:  Straight leg raise test: Negative, Slump test: Negative Hip pain and tightness with L SLR no change in back pain   FUNCTIONAL TESTS:  5 times sit to stand: 18.76 sec    SLS R 3 sec; L 1 sec  GAIT: Distance walked: 40 feet  Assistive device utilized: None Level of assistance: Complete Independence Comments: antalgic gait with significant limp on L LE; L LE flexed at hip and knee; trunk flexed forward; weight shifted to the R   TREATMENT DATE: view of evaluation; POC; HEP  Avoid sitting wth ankles or knees crossed Avoid recliner or modify recliner  PATIENT EDUCATION:  Education details: POC; HEP  Person educated: Patient Education method: Programmer, Multimedia, Demonstration, Actor cues, Verbal cues, and Handouts Education comprehension: verbalized understanding, returned demonstration, verbal cues required, tactile cues required, and needs further  education  HOME EXERCISE PROGRAM: Access Code: [email or web address] URL: [email or web address] Date: 08/09/2024 Prepared by: James Senn  Exercises - Supine Piriformis Stretch with Leg Straight  - 2 x daily - 7 x weekly - 1 sets - 3 reps - 30 sec  hold - Bent Knee Fallouts  - 2 x daily - 7 x weekly - 1-2 sets - 10 reps - 2-3 sec  hold - Hooklying Hamstring Stretch with Strap  - 1 x daily - 7 x weekly - 1 sets - 3 reps - 30 sec  hold - Supine ITB Stretch with Strap  - 1 x daily - 7 x weekly - 1 sets - 3 reps - 30 sec  hold - Hip Flexor Stretch at Edge of Bed  - 2 x daily - 7 x weekly - 1 sets - 3 reps - 30 sec  hold - Supine Sciatic Nerve Glide  - 2 x daily - 7 x weekly - 1 sets - 8-10 reps - 1-2 sec  hold  ASSESSMENT:  CLINICAL IMPRESSION: Patient is a 78 y.o. female who was seen today for physical therapy evaluation and treatment for chronic L sided LBP with L sided sciatica. She reports significant increase in symptoms in the past 2 weeks with no known injury. Patient has poor posture and alignment; limited trunk and L > R LE mobility/ROM; decreased strength L > R LE; core weakness; abnormal gait pattern; antalgic gait; muscular tightness to palpation with significant tightness and shortening through the hip flexors L > R; abnormal postures and transfers/transitional movement. Patient will benefit from phyaical therapy to address problems identified.   OBJECTIVE IMPAIRMENTS: Abnormal gait, decreased activity tolerance, decreased balance, decreased endurance, decreased mobility, decreased ROM, decreased strength, increased fascial restrictions, impaired flexibility, improper body mechanics, postural dysfunction, and pain.   ACTIVITY LIMITATIONS: carrying, lifting, bending, sitting, standing, squatting, transfers, bed mobility, dressing, and locomotion level  PARTICIPATION LIMITATIONS: meal prep, cleaning, laundry, driving, shopping, community activity, and yard work  PERSONAL FACTORS:  Behavior pattern, Fitness, Past/current experiences, Profession, Time since onset of injury/illness/exacerbation, and  comorbidities as noted above are also affecting patient's functional outcome.   REHAB POTENTIAL: Good  CLINICAL DECISION MAKING: Evolving/moderate complexity  EVALUATION COMPLEXITY: Moderate   GOALS: Goals reviewed with patient? Yes  SHORT TERM GOALS: Target date: 09/06/2024   Independent in initial HEP  Baseline: Goal status: INITIAL  2.  Improve tissue extensibility through the lumbar spine and L? R LE's allowing patient to achieve good upright posture in standing  Baseline:  Goal status: INITIAL  3.  Improve gait with patient to report minimal pain with standing and walking > 15-20 minutes  Baseline:  Goal status: INITIAL  4.  Patient reports 50% decrease in pain with moving sit to stand  Baseline:  Goal status: INITIAL   LONG TERM GOALS: Target date: 10/04/2024   Decrease back and L LE pain by 75-90% allowing patient to return to normal functional activities  Baseline:  Goal status: INITIAL  2.  Increase hip extension ROM to neutral bilat to improve functional activity tolerance  Baseline:  Goal status: INITIAL  3.  Increase strength bilat LE's to 4+/5 to 5/5  Baseline:  Goal status: INITIAL  4.  Patient reports minimal to no pain with transitioning from sit  to stand  Baseline:  Goal status: INITIAL  5.  Improve modified Oswestry score by 10%  Baseline: 13/50; 26%  Goal status: INITIAL  6.  Independent in advance HEP  Baseline:  Goal status: INITIAL  PLAN:  PT FREQUENCY: 2x/week  PT DURATION: 8 weeks  PLANNED INTERVENTIONS: [id number]- PT Re-evaluation, 97110-Therapeutic exercises, 97530- Therapeutic activity, 97112- Neuromuscular re-education, 97535- Self Care, [id number]- Manual therapy, [phone]- Gait training, [phone]- Electrical stimulation (unattended), Patient/Family education, Balance training, Stair training, Taping, and Joint  mobilization.  PLAN FOR NEXT SESSION: review and progress exercises; continue with back care and ergonomic education/correction; manual work and modalities as indicated    Danielle Parsons P Danielle Parsons, PT 08/09/2024, 12:08 PM

## 2024-08-09 ENCOUNTER — Encounter: Payer: Self-pay | Admitting: Rehabilitative and Restorative Service Providers"

## 2024-08-09 ENCOUNTER — Ambulatory Visit: Attending: Urgent Care | Admitting: Rehabilitative and Restorative Service Providers"

## 2024-08-09 ENCOUNTER — Other Ambulatory Visit: Payer: Self-pay

## 2024-08-09 DIAGNOSIS — R2689 Other abnormalities of gait and mobility: Secondary | ICD-10-CM | POA: Insufficient documentation

## 2024-08-09 DIAGNOSIS — G8929 Other chronic pain: Secondary | ICD-10-CM | POA: Insufficient documentation

## 2024-08-09 DIAGNOSIS — M5432 Sciatica, left side: Secondary | ICD-10-CM | POA: Insufficient documentation

## 2024-08-09 DIAGNOSIS — M6281 Muscle weakness (generalized): Secondary | ICD-10-CM | POA: Diagnosis present

## 2024-08-09 DIAGNOSIS — M5442 Lumbago with sciatica, left side: Secondary | ICD-10-CM | POA: Diagnosis not present

## 2024-08-09 DIAGNOSIS — R29898 Other symptoms and signs involving the musculoskeletal system: Secondary | ICD-10-CM | POA: Diagnosis present

## 2024-08-09 DIAGNOSIS — M5459 Other low back pain: Secondary | ICD-10-CM | POA: Insufficient documentation

## 2024-08-16 ENCOUNTER — Encounter: Payer: Self-pay | Admitting: Rehabilitative and Restorative Service Providers"

## 2024-08-16 ENCOUNTER — Ambulatory Visit: Admitting: Rehabilitative and Restorative Service Providers"

## 2024-08-16 DIAGNOSIS — R29898 Other symptoms and signs involving the musculoskeletal system: Secondary | ICD-10-CM | POA: Diagnosis present

## 2024-08-16 DIAGNOSIS — M5459 Other low back pain: Secondary | ICD-10-CM | POA: Insufficient documentation

## 2024-08-16 DIAGNOSIS — M6281 Muscle weakness (generalized): Secondary | ICD-10-CM | POA: Insufficient documentation

## 2024-08-16 DIAGNOSIS — R2689 Other abnormalities of gait and mobility: Secondary | ICD-10-CM | POA: Insufficient documentation

## 2024-08-16 DIAGNOSIS — M5432 Sciatica, left side: Secondary | ICD-10-CM | POA: Diagnosis present

## 2024-08-16 NOTE — Therapy (Signed)
 OUTPATIENT PHYSICAL THERAPY THORACOLUMBAR TREATMENT   Patient Name: Danielle Parsons MRN: [id number] DOB:02/09/1946, 78 y.o., female Today's Date: 08/16/2024  END OF SESSION:  PT End of Session - 08/16/24 1314     Visit Number 2    Number of Visits 16    Date for Recertification  10/04/24    Authorization Type medicare and [employer] of [place]    Progress Note Due on Visit 10    PT Start Time 1315    PT Stop Time 1400    PT Time Calculation (min) 45 min    Activity Tolerance Patient tolerated treatment well          Past Medical History:  Diagnosis Date   Anxiety    H/O bladder infections    Past Surgical History:  Procedure Laterality Date   CESAREAN SECTION  1979   Patient Active Problem List   Diagnosis Date Noted   Right elbow pain 09/27/2019   Right shoulder pain 09/27/2019   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   Atrophic vaginitis 01/05/2018   Female stress incontinence 01/05/2018   GAD (generalized anxiety disorder) 02/18/2017   Osteopenia of multiple sites 02/18/2017   Cystocele with rectocele 11/16/2016    PCP: Benton LITTIE Quay, PA  REFERRING PROVIDER: Benton LITTIE Quay, PA  REFERRING DIAG: 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 [employer] video which is 20 minutes. Back pain and L hip pain is the same   Eval: Patient reports that the L LBP and L LE pain for years but  it has gotten worse in the past couple of weeks with no known injury. She has had pain over the past two years when walking but the pain resolved with rest. Sometimes feels weak in the L leg but has not fallen.   PERTINENT HISTORY:  Anxiety; bladder infections; bladder tack 2018; C-section 1979; osteoporosis   PAIN:  Are you having pain? Yes: NPRS scale: 4/10; best in past week 0/10; worst in past week 6/10  Pain location: across back;  L leg -posterior to lateral calf and anterior shin  Pain description: sharp; groin  Aggravating factors: sitting; moving from sit to stand  Relieving factors: walking; resting   PRECAUTIONS: None   WEIGHT BEARING RESTRICTIONS: No  FALLS:  Has patient fallen in last 6 months? No  LIVING ENVIRONMENT: Lives with: lives alone Lives in: House/apartment Stairs: Yes: External: 3  steps; on left going up Has following equipment at home: Single point cane  OCCUPATION: retired from [employer] - retired ~ 10 years ago some sitting at work but some standing Cooking; cleaning; household chores; yard work; walking daily ~ 30 min inclines; recliner    PATIENT GOALS: get rid of pain and walk without limping   NEXT MD VISIT: 12/05/24  OBJECTIVE:  Note: Objective measures were completed at Evaluation unless otherwise noted.  DIAGNOSTIC FINDINGS:  None for back or hip   PATIENT SURVEYS:  Modified Oswestry Low Back Scale - 13/50; 26%   SENSATION: Some tingling in L LE   MUSCLE LENGTH: Hamstrings: Right 65 deg; Left 60 deg Hip flexor tightness: Right -5 to -10 deg; Left -10 -15 deg  POSTURE: rounded shoulders, forward head, decreased lumbar lordosis, increased thoracic kyphosis, flexed trunk , and weight shift right  PALPATION: Significant tightness in L >> R hip flexors (iliacus and psoas); proximal quads; posterior hip through the piriformis; glut med/min; bilat lumbar paraspinals   LUMBAR ROM: pain with lumbar ROM in all planes   AROM eval  Flexion  70%  Extension 40%  Right lateral flexion 30%  Left lateral flexion 40%  Right rotation 20%  Left rotation 25%   (Blank rows = not tested)  LOWER EXTREMITY ROM:   approximate A-AAROM   Active  Right eval Left eval  Hip flexion 110 105  Hip extension -5 to-10  -10 to -15   Hip abduction    Hip adduction    Hip internal rotation Tight  Tight > than R  Hip external rotation Tight  Tight > than R   Knee flexion WNL's WNL's  Knee extension WNL's  WNL's   Ankle dorsiflexion    Ankle plantarflexion    Ankle inversion    Ankle eversion     (Blank rows = not tested)  LOWER EXTREMITY MMT:  assessed in supine and sidelying   MMT Right eval Left eval  Hip flexion 4/5 Discomfort  Painful AROM  Hip extension    Hip abduction 4 4-  Hip adduction    Hip internal rotation    Hip external rotation    Knee flexion    Knee extension    Ankle dorsiflexion    Ankle plantarflexion    Ankle inversion    Ankle eversion     (Blank rows = not tested)  LUMBAR SPECIAL TESTS:  Straight leg raise test: Negative, Slump test: Negative Hip pain and tightness with L SLR no change in back pain   FUNCTIONAL TESTS:  5 times sit to stand: 18.76 sec    SLS R 3 sec; L 1 sec  GAIT: Distance walked: 40 feet  Assistive device utilized: None Level of assistance: Complete Independence Comments: antalgic gait with significant limp on L LE; L LE flexed at hip and knee; trunk flexed forward; weight shifted to the R    [facility] Adult PT Treatment:  DATE: 08/16/24 Therapeutic Exercise: Hamstring stretch with strap 30 sec x 3 ITB stretch with strap 30 sec x 3 Piriformis stretch travell supine 30 sec x 3  Hip flexor stretch supine 30 sec x 3 Hip drop in hooklying 15-20 reps  Therapeutic Activity: Sciatic nerve glide supine x 10 R/L Bridging 3 sec x 10  Hip abduction hooklying alternating LE's 3 sec x 10 R/L  Counter plank 30-60 sec x 3   Self Care: Avoid  sitting in recliner Avoid crossing legs or ankles  Hold on denise austin exercise video   TREATMENT DATE: view of evaluation; POC; HEP  Avoid sitting wth ankles or knees crossed Avoid recliner or modify recliner                                                                                                                                 PATIENT EDUCATION:  Education details: POC; HEP  Person educated: Patient Education method: Explanation, Demonstration, Tactile cues, Verbal cues, and Handouts Education comprehension: verbalized understanding, returned demonstration, verbal cues required, tactile cues required, and needs further education  HOME EXERCISE PROGRAM: Access Code: [email or web address] URL: [email or web address] Date: 08/16/2024 Prepared by: Shamila Lerch  Exercises - Supine Piriformis Stretch with Leg Straight  - 2 x daily - 7 x weekly - 1 sets - 3 reps - 30 sec  hold - Bent Knee Fallouts  - 2 x daily - 7 x weekly - 1-2 sets - 10 reps - 2-3 sec  hold - Hooklying Hamstring Stretch with Strap  - 1 x daily - 7 x weekly - 1 sets - 3 reps - 30 sec  hold - Supine ITB Stretch with Strap  - 1 x daily - 7 x weekly - 1 sets - 3 reps - 30 sec  hold - Hip Flexor Stretch at Edge of Bed  - 2 x daily - 7 x weekly - 1 sets - 3 reps - 30 sec  hold - Supine Sciatic Nerve Glide  - 2 x daily - 7 x weekly - 1 sets - 8-10 reps - 1-2 sec  hold - Supine Bridge  - 2 x daily - 7 x weekly - 1-2 sets - 10 reps - 5-10 sec  hold - Plank on Counter  - 1 x daily - 7 x weekly - 1 sets - 3 reps - 30 sec  hold - Hooklying Isometric Clamshell  - 2 x daily - 7 x weekly - 1 sets - 10 reps - 3 sec  hold  ASSESSMENT:  CLINICAL IMPRESSION: Patient returns reporting no change in symptoms. She has been doing exercises with an exercise video which she demonstrated some of in clinic. Suggested the patient hold on her exercise video and continue with exercises as instructed in the clinic. Reviewed exercises  correcting technique. Progressed with core stabilization.   Eval: Patient is a 78 y.o. female who was seen today for physical therapy evaluation and  treatment for chronic L sided LBP with L sided sciatica. She reports significant increase in symptoms in the past 2 weeks with no known injury. Patient has poor posture and alignment; limited trunk and L > R LE mobility/ROM; decreased strength L > R LE; core weakness; abnormal gait pattern; antalgic gait; muscular tightness to palpation with significant tightness and shortening through the hip flexors L > R; abnormal postures and transfers/transitional movement. Patient will benefit from phyaical therapy to address problems identified.   GOALS: Goals reviewed with patient? Yes  SHORT TERM GOALS: Target date: 09/06/2024   Independent in initial HEP  Baseline: Goal status: INITIAL  2.  Improve tissue extensibility through the lumbar spine and L? R LE's allowing patient to achieve good upright posture in standing  Baseline:  Goal status: INITIAL  3.  Improve gait with patient to report minimal pain with standing and walking > 15-20 minutes  Baseline:  Goal status: INITIAL  4.  Patient reports 50% decrease in pain with moving sit to stand  Baseline:  Goal status: INITIAL   LONG TERM GOALS: Target date: 10/04/2024   Decrease back and L LE pain by 75-90% allowing patient to return to normal functional activities  Baseline:  Goal status: INITIAL  2.  Increase hip extension ROM to neutral bilat to improve functional activity tolerance  Baseline:  Goal status: INITIAL  3.  Increase strength bilat LE's to 4+/5 to 5/5  Baseline:  Goal status: INITIAL  4.  Patient reports minimal to no pain with transitioning from sit to stand  Baseline:  Goal status: INITIAL  5.  Improve modified Oswestry score by 10%  Baseline: 13/50; 26%  Goal status: INITIAL  6.  Independent in advance HEP  Baseline:  Goal status: INITIAL  PLAN:  PT  FREQUENCY: 2x/week  PT DURATION: 8 weeks  PLANNED INTERVENTIONS: 97164- PT Re-evaluation, 97110-Therapeutic exercises, 97530- Therapeutic activity, 97112- Neuromuscular re-education, 97535- Self Care, 02859- Manual therapy, [phone]- Gait training, [phone]- Electrical stimulation (unattended), Patient/Family education, Balance training, Stair training, Taping, and Joint mobilization.  PLAN FOR NEXT SESSION: review and progress exercises; continue with back care and ergonomic education/correction; manual work and modalities as indicated    Mikenzie Mccannon P Jiraiya Mcewan, PT 08/16/2024, 1:15 PM

## 2024-08-18 ENCOUNTER — Ambulatory Visit

## 2024-08-18 DIAGNOSIS — M5459 Other low back pain: Secondary | ICD-10-CM | POA: Diagnosis not present

## 2024-08-18 DIAGNOSIS — R29898 Other symptoms and signs involving the musculoskeletal system: Secondary | ICD-10-CM

## 2024-08-18 DIAGNOSIS — R2689 Other abnormalities of gait and mobility: Secondary | ICD-10-CM

## 2024-08-18 DIAGNOSIS — M5432 Sciatica, left side: Secondary | ICD-10-CM

## 2024-08-18 DIAGNOSIS — M6281 Muscle weakness (generalized): Secondary | ICD-10-CM

## 2024-08-18 NOTE — Therapy (Signed)
 OUTPATIENT PHYSICAL THERAPY THORACOLUMBAR TREATMENT   Patient Name: Danielle Parsons MRN: [id number] DOB:12-17-1945, 78 y.o., female Today's Date: 08/18/2024  END OF SESSION:  PT End of Session - 08/18/24 1101     Visit Number 3    Number of Visits 16    Date for Recertification  10/04/24    Authorization Type medicare and [employer] of [place]    Progress Note Due on Visit 10    PT Start Time 1102    PT Stop Time 1145    PT Time Calculation (min) 43 min    Activity Tolerance Patient tolerated treatment well    Behavior During Therapy [facility] for tasks assessed/performed         Past Medical History:  Diagnosis Date   Anxiety    H/O bladder infections    Past Surgical History:  Procedure Laterality Date   CESAREAN SECTION  1979   Patient Active Problem List   Diagnosis Date Noted   Right elbow pain 09/27/2019   Right shoulder pain 09/27/2019   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   Atrophic vaginitis 01/05/2018   Female stress incontinence 01/05/2018   GAD (generalized anxiety disorder) 02/18/2017   Osteopenia of multiple sites 02/18/2017   Cystocele with rectocele 11/16/2016    PCP: Benton LITTIE Quay, PA  REFERRING PROVIDER: Benton LITTIE Quay, PA  REFERRING DIAG: 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  Eval: Patient reports that the L LBP and L LE pain for years but it has gotten worse in the past couple  of weeks with no known injury. She has had pain over the past two years when walking but the pain resolved with rest. Sometimes feels weak in the L leg but has not fallen.   PERTINENT HISTORY:  Anxiety; bladder infections; bladder tack 2018; C-section 1979; osteoporosis   PAIN:  Are you having pain? Yes: NPRS scale: 4/10; best in past week 0/10; worst in past week 6/10  Pain location: across back; L leg -posterior to lateral calf and anterior shin  Pain description: sharp;  groin  Aggravating factors: sitting; moving from sit to stand  Relieving factors: walking; resting   PRECAUTIONS: None   WEIGHT BEARING RESTRICTIONS: No  FALLS:  Has patient fallen in last 6 months? No  LIVING ENVIRONMENT: Lives with: lives alone Lives in: House/apartment Stairs: Yes: External: 3  steps; on left going up Has following equipment at home: Single point cane  OCCUPATION: retired from [employer] - retired ~ 10 years ago some sitting at work but some standing Cooking; cleaning; household chores; yard work; walking daily ~ 30 min inclines; recliner    PATIENT GOALS: get rid of pain and walk without limping   NEXT MD VISIT: 12/05/24  OBJECTIVE:  Note: Objective measures were completed at Evaluation unless otherwise noted.  DIAGNOSTIC FINDINGS:  None for back or hip   PATIENT SURVEYS:  Modified Oswestry Low Back Scale - 13/50; 26%   SENSATION: Some tingling in L LE   MUSCLE LENGTH: Hamstrings: Right 65 deg; Left 60 deg Hip flexor tightness: Right -5 to -10 deg; Left -10 -15 deg  POSTURE: rounded shoulders, forward head, decreased lumbar lordosis, increased thoracic kyphosis, flexed trunk , and weight shift right  PALPATION: Significant tightness in L >> R hip flexors (iliacus and psoas); proximal quads; posterior hip through the piriformis; glut med/min; bilat lumbar paraspinals   LUMBAR ROM: pain with lumbar ROM in all planes   AROM eval  Flexion 70%  Extension 40%  Right lateral  flexion 30%  Left lateral flexion 40%  Right rotation 20%  Left rotation 25%   (Blank rows = not tested)  LOWER EXTREMITY ROM:   approximate A-AAROM   Active  Right eval Left eval  Hip flexion 110 105  Hip extension -5 to-10  -10 to -15   Hip abduction    Hip adduction    Hip internal rotation Tight  Tight > than R  Hip external rotation Tight  Tight > than R   Knee flexion WNL's WNL's  Knee extension WNL's  WNL's   Ankle dorsiflexion    Ankle plantarflexion    Ankle inversion    Ankle eversion     (Blank rows = not tested)  LOWER EXTREMITY MMT:  assessed in supine and sidelying   MMT Right eval Left eval  Hip flexion 4/5 Discomfort  Painful AROM  Hip extension    Hip abduction 4 4-  Hip adduction    Hip internal rotation    Hip external rotation    Knee flexion    Knee extension    Ankle dorsiflexion    Ankle plantarflexion    Ankle inversion    Ankle eversion     (Blank rows = not tested)  LUMBAR SPECIAL TESTS:  Straight leg raise test: Negative, Slump test: Negative Hip pain and tightness with L SLR no change in back pain   FUNCTIONAL TESTS:  5 times sit to stand: 18.76 sec    SLS R 3 sec; L 1 sec  GAIT: Distance walked: 40 feet  Assistive device utilized: None Level of assistance: Complete Independence Comments: antalgic gait with significant limp on L LE; L LE flexed at hip and knee; trunk flexed forward; weight shifted to the R    [facility] Adult PT Treatment:  DATE: 08/18/2024 Therapeutic Exercise: Supine:  Assisted hip flexor stretch off edge of table in supine Piriformis stretch with opp leg straight 3 x 30 sec Supported hip adductor stretch (butterfly) 3 x 30 sec Alt HS & ITB stretches with strap 2 x 30 sec each  Neuromuscular re-ed: Bridges x10 --> bridges + green TB around thighs x10 Hooklying clamshell + green TB around thighs 10 x 3 sec  Resisted bent knee fall out + red TB around thighs x10  (bil) Side Lying: Clamshells --> poor glute activation, primarily felt hip add tension Reverse clamshells 2x10 Therapeutic Activity: Prone on elbows Prone press up x10   [facility] Adult PT Treatment:                                                DATE: 08/16/24 Therapeutic Exercise: Hamstring stretch with strap 30 sec x 3 ITB stretch with strap 30 sec x 3 Piriformis stretch travell supine 30 sec x 3  Hip flexor stretch supine 30 sec x 3 Hip drop in hooklying 15-20 reps  Therapeutic Activity: Sciatic nerve glide supine x 10 R/L Bridging 3 sec x 10  Hip abduction hooklying alternating LE's 3 sec x 10 R/L  Counter plank 30-60 sec x 3   Self Care: Avoid sitting in recliner Avoid crossing legs or ankles  Hold on denise austin exercise video   TREATMENT DATE: view of evaluation; POC; HEP  Avoid sitting wth ankles or knees crossed Avoid recliner or modify recliner                                                                                                                                 PATIENT EDUCATION:  Education details: Updated HEP  Person educated: Patient Education method: Explanation, Demonstration, Tactile cues, Verbal cues, and Handouts Education comprehension: verbalized understanding, returned demonstration, verbal cues required, tactile cues required, and needs further education  HOME EXERCISE PROGRAM: Access Code: [email or web address] URL: [email or web address] Date: 08/18/2024 Prepared by: Lamarr Price  Exercises - Supine Piriformis Stretch with Leg Straight  - 2 x daily - 7 x weekly - 1 sets - 3 reps - 30 sec  hold - Bent Knee Fallouts  - 2 x daily - 7 x weekly - 1-2 sets - 10 reps - 2-3 sec  hold - Hooklying Hamstring Stretch with Strap  - 1 x daily - 7 x weekly - 1 sets - 3 reps - 30 sec  hold - Supine ITB Stretch with Strap  - 1 x daily - 7 x weekly - 1 sets - 3 reps - 30 sec  hold - Hip Flexor Stretch at Edge of Bed  - 2 x daily - 7 x weekly - 1 sets -  3 reps - 30 sec  hold - Supine Sciatic Nerve Glide  -  2 x daily - 7 x weekly - 1 sets - 8-10 reps - 1-2 sec  hold - Supine Bridge  - 2 x daily - 7 x weekly - 1-2 sets - 10 reps - 5-10 sec  hold - Hooklying Isometric Clamshell  - 2 x daily - 7 x weekly - 1 sets - 10 reps - 3 sec  hold - Plank on Counter  - 1 x daily - 7 x weekly - 1 sets - 3 reps - 30 sec  hold - Prone Press Up  - 2 x daily - 7 x weekly - 1 sets - 10 reps  ASSESSMENT:  CLINICAL IMPRESSION: Significant tightness noted in hip adductors on Lt side. Tactile cues improved glute activation during side lying hip internal rotation exercise; poor glute activation demonstrated during side lying hip external rotation. Good response with prone press up with low back tension; exercise added to HEP.  Eval: Patient is a 78 y.o. female who was seen today for physical therapy evaluation and treatment for chronic L sided LBP with L sided sciatica. She reports significant increase in symptoms in the past 2 weeks with no known injury. Patient has poor posture and alignment; limited trunk and L > R LE mobility/ROM; decreased strength L > R LE; core weakness; abnormal gait pattern; antalgic gait; muscular tightness to palpation with significant tightness and shortening through the hip flexors L > R; abnormal postures and transfers/transitional movement. Patient will benefit from phyaical therapy to address problems identified.   GOALS: Goals reviewed with patient? Yes  SHORT TERM GOALS: Target date: 09/06/2024  Independent in initial HEP  Baseline: Goal status: INITIAL  2.  Improve tissue extensibility through the lumbar spine and L? R LE's allowing patient to achieve good upright posture in standing  Baseline:  Goal status: INITIAL  3.  Improve gait with patient to report minimal pain with standing and walking > 15-20 minutes  Baseline:  Goal status: INITIAL  4.  Patient reports 50% decrease in pain with moving sit to stand  Baseline:   Goal status: INITIAL   LONG TERM GOALS: Target date: 10/04/2024  Decrease back and L LE pain by 75-90% allowing patient to return to normal functional activities  Baseline:  Goal status: INITIAL  2.  Increase hip extension ROM to neutral bilat to improve functional activity tolerance  Baseline:  Goal status: INITIAL  3.  Increase strength bilat LE's to 4+/5 to 5/5  Baseline:  Goal status: INITIAL  4.  Patient reports minimal to no pain with transitioning from sit to stand  Baseline:  Goal status: INITIAL  5.  Improve modified Oswestry score by 10%  Baseline: 13/50; 26%  Goal status: INITIAL  6.  Independent in advance HEP  Baseline:  Goal status: INITIAL  PLAN:  PT FREQUENCY: 2x/week  PT DURATION: 8 weeks  PLANNED INTERVENTIONS: 97164- PT Re-evaluation, 97110-Therapeutic exercises, 97530- Therapeutic activity, 97112- Neuromuscular re-education, 97535- Self Care, 02859- Manual therapy, [phone]- Gait training, [phone]- Electrical stimulation (unattended), Patient/Family education, Balance training, Stair training, Taping, and Joint mobilization.  PLAN FOR NEXT SESSION: review and progress exercises; continue with back care and ergonomic education/correction; manual work and modalities as indicated    Lamarr GORMAN Price, PTA 08/18/2024, 11:47 AM

## 2024-08-21 ENCOUNTER — Ambulatory Visit: Admitting: Rehabilitative and Restorative Service Providers"

## 2024-08-21 ENCOUNTER — Encounter: Payer: Self-pay | Admitting: Rehabilitative and Restorative Service Providers"

## 2024-08-21 DIAGNOSIS — M5459 Other low back pain: Secondary | ICD-10-CM

## 2024-08-21 DIAGNOSIS — M6281 Muscle weakness (generalized): Secondary | ICD-10-CM

## 2024-08-21 DIAGNOSIS — R2689 Other abnormalities of gait and mobility: Secondary | ICD-10-CM

## 2024-08-21 DIAGNOSIS — R29898 Other symptoms and signs involving the musculoskeletal system: Secondary | ICD-10-CM

## 2024-08-21 DIAGNOSIS — M5432 Sciatica, left side: Secondary | ICD-10-CM

## 2024-08-21 NOTE — Therapy (Signed)
 OUTPATIENT PHYSICAL THERAPY THORACOLUMBAR TREATMENT   Patient Name: Danielle Parsons MRN: [id number] DOB:May 30, 1946, 78 y.o., female Today's Date: 08/21/2024  END OF SESSION:  PT End of Session - 08/21/24 0933     Visit Number 4    Date for Recertification  10/04/24    Authorization Type medicare and [employer] of [place]    Progress Note Due on Visit 10    PT Start Time 0930    PT Stop Time 1015    PT Time Calculation (min) 45 min    Activity Tolerance Patient tolerated treatment well         Past Medical History:  Diagnosis Date   Anxiety    H/O bladder infections    Past Surgical History:  Procedure Laterality Date   CESAREAN SECTION  1979   Patient Active Problem List   Diagnosis Date Noted   Right elbow pain 09/27/2019   Right shoulder pain 09/27/2019   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   Atrophic vaginitis 01/05/2018   Female stress incontinence 01/05/2018   GAD (generalized anxiety disorder) 02/18/2017   Osteopenia of multiple sites 02/18/2017   Cystocele with rectocele 11/16/2016    PCP: Benton LITTIE Quay, PA  REFERRING PROVIDER: Benton LITTIE Quay, PA  REFERRING DIAG: 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 She continues to limp with weight bearing L LE when walking  Eval: Patient reports that the L LBP and L LE pain for years but it has gotten worse in the past couple of weeks with no known injury. She has had pain  over the past two years when walking but the pain resolved with rest. Sometimes feels weak in the L leg but has not fallen.   PERTINENT HISTORY:  Anxiety; bladder infections; bladder tack 2018; C-section 1979; osteoporosis   PAIN:  Are you having pain? Yes: NPRS scale: 2-3/10; best in past week 0/10; worst in past week 6/10  Pain location: across back; L leg -posterior to lateral calf and anterior shin  Pain description: sharp; groin  Aggravating factors: sitting; moving from sit to stand  Relieving factors: walking; resting   PRECAUTIONS: None   WEIGHT BEARING RESTRICTIONS: No  FALLS:  Has patient fallen in last 6 months? No  LIVING ENVIRONMENT: Lives with: lives alone Lives in: House/apartment Stairs: Yes: External: 3  steps; on left going up Has following equipment at home: Single point cane  OCCUPATION: retired from [employer] - retired ~ 10 years ago some sitting at work but some standing Cooking; cleaning; household chores; yard work; walking daily ~ 30 min inclines; recliner    PATIENT GOALS: get rid of pain and walk without limping   NEXT MD VISIT: 12/05/24  OBJECTIVE:  Note: Objective measures were completed at Evaluation unless otherwise noted.  DIAGNOSTIC FINDINGS:  None for back or hip   PATIENT SURVEYS:  Modified Oswestry Low Back Scale - 13/50; 26%   SENSATION: Some tingling in L LE   MUSCLE LENGTH: Hamstrings: Right 65 deg; Left 60 deg Hip flexor tightness: Right -5 to -10 deg; Left -10 -15 deg  POSTURE: rounded shoulders, forward head, decreased lumbar lordosis, increased thoracic kyphosis, flexed trunk , and weight shift right  PALPATION: Significant tightness in L >> R hip flexors (iliacus and psoas); proximal quads; posterior hip through the piriformis; glut med/min; bilat lumbar paraspinals   LUMBAR ROM: pain with lumbar ROM in all planes   AROM eval  Flexion 70%  Extension 40%  Right lateral flexion 30%  Left lateral flexion 40%  Right  rotation 20%  Left rotation 25%   (Blank rows = not tested)  LOWER EXTREMITY ROM:   approximate A-AAROM   Active  Right eval Left eval  Hip flexion 110 105  Hip extension -5 to-10  -10 to -15   Hip abduction    Hip adduction    Hip internal rotation Tight  Tight > than R  Hip external rotation Tight  Tight > than R   Knee flexion WNL's WNL's  Knee extension WNL's  WNL's   Ankle dorsiflexion    Ankle plantarflexion    Ankle inversion    Ankle eversion     (Blank rows = not tested)  LOWER EXTREMITY MMT:  assessed in supine and sidelying   MMT Right eval Left eval  Hip flexion 4/5 Discomfort  Painful AROM  Hip extension    Hip abduction 4 4-  Hip adduction    Hip internal rotation    Hip external rotation    Knee flexion    Knee extension    Ankle dorsiflexion    Ankle plantarflexion    Ankle inversion    Ankle eversion     (Blank rows = not tested)  LUMBAR SPECIAL TESTS:  Straight leg raise test: Negative, Slump test: Negative Hip pain and tightness with L SLR no change in back pain   FUNCTIONAL TESTS:  5 times sit to stand: 18.76 sec    SLS R 3 sec; L 1 sec  GAIT: Distance walked: 40 feet  Assistive device utilized: None Level of assistance: Complete Independence Comments: antalgic gait with significant limp on L LE; L LE flexed at hip and knee; trunk flexed forward; weight shifted to the R    [facility] Adult PT Treatment:                                                DATE: 08/21/2024 Therapeutic Exercise: Supine:  Assisted hip flexor  stretch off edge of table in supine 30 sec x 1; 45 sec x 1  Piriformis stretch with opp leg straight 3 x 30 sec Supported hip adductor stretch (butterfly) 2 x 30 sec HS & ITB stretches with strap 2 x 30 sec each  Manual: Supine   Iliacus release L 90 sec  STM/TPR L psoas; hip adductors; quads  Passive adductor stretch pt supine  Myofacial release with L LE working onto IR with hip and leg extended  Prone  STM/TPR L  gluts/piriformis  Passive knee flexion stretch 20-30 sec x 3 Windshield wiper hip extended; knee flexed 90 deg for IR/ER ROM  Neuromuscular re-ed: Prone glut set 10 sec x 10  Standing anatomical position back to wall with glut set  Therapeutic Activity: Prone on elbows Prone press up x10 Standing posture and alignment Walking with VC for alignment and engaging core and gluts  Backward walking with focus on glut activation and hip extension    [facility] Adult PT Treatment:                                                DATE: 08/18/2024 Therapeutic Exercise: Supine:  Assisted hip flexor stretch off edge of table in supine Piriformis stretch with opp leg straight 3 x 30 sec Supported hip adductor stretch (butterfly) 3 x 30 sec Alt HS & ITB stretches with strap 2 x 30 sec each  Neuromuscular re-ed: Bridges x10 --> bridges + green TB around thighs x10 Hooklying clamshell + green TB around thighs 10 x 3 sec  Resisted bent knee fall out + red TB around thighs x10 (bil) Side Lying: Clamshells --> poor glute activation, primarily felt hip add tension Reverse clamshells 2x10 Therapeutic Activity: Prone on elbows Prone press up x10   [facility] Adult PT Treatment:                                                DATE: 08/16/24 Therapeutic Exercise: Hamstring stretch with strap 30 sec x 3 ITB stretch with strap 30 sec x 3 Piriformis stretch travell supine 30 sec x 3  Hip flexor stretch supine 30 sec x 3 Hip drop in hooklying 15-20 reps  Therapeutic Activity: Sciatic nerve glide supine x 10 R/L Bridging 3 sec x 10  Hip abduction hooklying alternating LE's 3 sec x 10 R/L  Counter plank 30-60 sec x 3   Self Care: Avoid sitting in recliner Avoid crossing legs or ankles  Hold on denise austin exercise video   TREATMENT DATE: view of evaluation; POC; HEP  Avoid sitting wth ankles or knees crossed Avoid recliner or modify recliner  PATIENT EDUCATION:  Education details: Updated HEP  Person educated: Patient Education method: Explanation, Demonstration, Tactile cues, Verbal cues, and Handouts Education comprehension: verbalized understanding, returned demonstration, verbal cues required, tactile cues required, and needs further education  HOME EXERCISE PROGRAM: Access Code: [email or web address] URL: [email or web address] Date: 08/21/2024 Prepared by: Zohaib Heeney  Exercises - Supine Piriformis Stretch with Leg Straight  - 2 x daily - 7 x weekly - 1 sets - 3 reps - 30 sec  hold - Bent Knee Fallouts  - 2 x daily - 7 x weekly - 1-2 sets - 10 reps - 2-3 sec  hold - Hooklying Hamstring Stretch with Strap  - 1 x daily - 7 x weekly - 1 sets - 3 reps - 30 sec  hold - Supine ITB Stretch with Strap  - 1 x daily - 7 x weekly - 1 sets - 3 reps - 30 sec  hold - Hip Flexor Stretch at Edge of Bed  - 2 x daily - 7 x weekly - 1 sets - 3 reps - 30 sec  hold - Supine Sciatic Nerve Glide  - 2 x daily - 7 x weekly - 1 sets - 8-10 reps - 1-2 sec  hold - Supine Bridge  - 2 x daily - 7 x weekly - 1-2 sets - 10 reps - 5-10 sec  hold - Hooklying Isometric Clamshell  - 2 x daily - 7 x weekly - 1 sets - 10 reps - 3 sec  hold - Plank on Counter  - 1 x daily - 7 x weekly - 1 sets - 3 reps - 30 sec  hold - Prone Press Up  - 2 x daily - 7 x weekly - 1 sets - 10 reps - Prone Gluteal Sets  - 2 x daily - 7 x weekly - 1 sets - 10 reps - 10 sec  hold - Standing Anatomical Position with Scapular Retraction and Depression at Wall  - 2 x daily - 7 x weekly - 1 sets - 5-10 reps - 5-10 sec  hold  ASSESSMENT:  CLINICAL IMPRESSION: Patient reports some improvement in back and L hip pain. She has been working on her exercises at home. Persistent tightness noted in hip flexors including iliacus, psoas; quads; adductors on Lt side. Trial of manual work including release for iliacus and TRP/deep  tissue work through the L hip patient supine and prone. Added glut set in prone and standing posture with glut activation. Worked on gait forward and backward with focus on alignment and slowing down to improve gait pattern and decrease limp on L LE in weight bearing L with good improvement in gait pattern. Patient will work on gait for home with focus on alignment, glut set, slowing down. Good response to treatment.  Continue with previous exercises for home program. Good response to treatment. Continued stretching for L hip; strengthening with focus on gluts; prone press for home program.  Eval: Patient is a 78 y.o. female who was seen today for physical therapy evaluation and treatment for chronic L sided LBP with L sided sciatica. She reports significant increase in symptoms in the past 2 weeks with no known injury. Patient has poor posture and alignment; limited trunk and L > R LE mobility/ROM; decreased strength L > R LE; core weakness; abnormal gait pattern; antalgic gait; muscular tightness to palpation with significant tightness and shortening through the hip flexors L > R; abnormal postures and transfers/transitional movement. Patient will benefit from phyaical therapy  to address problems identified.   GOALS: Goals reviewed with patient? Yes  SHORT TERM GOALS: Target date: 09/06/2024  Independent in initial HEP  Baseline: Goal status: INITIAL  2.  Improve tissue extensibility through the lumbar spine and L? R LE's allowing patient to achieve good upright posture in standing  Baseline:  Goal status: INITIAL  3.  Improve gait with patient to report minimal pain with standing and walking > 15-20 minutes  Baseline:  Goal status: INITIAL  4.  Patient reports 50% decrease in pain with moving sit to stand  Baseline:  Goal status: INITIAL   LONG TERM GOALS: Target date: 10/04/2024  Decrease back and L LE pain by 75-90% allowing patient to return to normal functional activities   Baseline:  Goal status: INITIAL  2.  Increase hip extension ROM to neutral bilat to improve functional activity tolerance  Baseline:  Goal status: INITIAL  3.  Increase strength bilat LE's to 4+/5 to 5/5  Baseline:  Goal status: INITIAL  4.  Patient reports minimal to no pain with transitioning from sit to stand  Baseline:  Goal status: INITIAL  5.  Improve modified Oswestry score by 10%  Baseline: 13/50; 26%  Goal status: INITIAL  6.  Independent in advance HEP  Baseline:  Goal status: INITIAL  PLAN:  PT FREQUENCY: 2x/week  PT DURATION: 8 weeks  PLANNED INTERVENTIONS: 97164- PT Re-evaluation, 97110-Therapeutic exercises, 97530- Therapeutic activity, 97112- Neuromuscular re-education, 97535- Self Care, [id number]- Manual therapy, [id number]- Gait training, [phone]- Electrical stimulation (unattended), Patient/Family education, Balance training, Stair training, Taping, and Joint mobilization.  PLAN FOR NEXT SESSION: review and progress exercises; continue with back care and ergonomic education/correction; manual work and modalities as indicated    Shuayb Schepers P Olivia Royse, PT 08/21/2024, 9:34 AM

## 2024-08-23 ENCOUNTER — Encounter: Payer: Self-pay | Admitting: Rehabilitative and Restorative Service Providers"

## 2024-08-23 ENCOUNTER — Ambulatory Visit: Admitting: Rehabilitative and Restorative Service Providers"

## 2024-08-23 DIAGNOSIS — R29898 Other symptoms and signs involving the musculoskeletal system: Secondary | ICD-10-CM

## 2024-08-23 DIAGNOSIS — M5459 Other low back pain: Secondary | ICD-10-CM

## 2024-08-23 DIAGNOSIS — R2689 Other abnormalities of gait and mobility: Secondary | ICD-10-CM

## 2024-08-23 DIAGNOSIS — M6281 Muscle weakness (generalized): Secondary | ICD-10-CM

## 2024-08-23 DIAGNOSIS — M5432 Sciatica, left side: Secondary | ICD-10-CM

## 2024-08-23 NOTE — Therapy (Signed)
 OUTPATIENT PHYSICAL THERAPY THORACOLUMBAR TREATMENT   Patient Name: Danielle Parsons MRN: [id number] DOB:04/24/1946, 78 y.o., female Today's Date: 08/23/2024  END OF SESSION:  PT End of Session - 08/23/24 1314     Visit Number 5    Number of Visits 16    Date for Recertification  10/04/24    Authorization Type medicare and [employer] of [place]    Progress Note Due on Visit 10    PT Start Time 1315    PT Stop Time 1400    PT Time Calculation (min) 45 min    Activity Tolerance Patient tolerated treatment well         Past Medical History:  Diagnosis Date   Anxiety    H/O bladder infections    Past Surgical History:  Procedure Laterality Date   CESAREAN SECTION  1979   Patient Active Problem List   Diagnosis Date Noted   Right elbow pain 09/27/2019   Right shoulder pain 09/27/2019   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   Atrophic vaginitis 01/05/2018   Female stress incontinence 01/05/2018   GAD (generalized anxiety disorder) 02/18/2017   Osteopenia of multiple sites 02/18/2017   Cystocele with rectocele 11/16/2016    PCP: Benton LITTIE Quay, PA  REFERRING PROVIDER: Benton LITTIE Quay, PA  REFERRING DIAG: 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 She is walking better with less limp with weight bearing L LE when walking. That massage you did made so much difference.  Eval: Patient reports that the L LBP and L LE pain for years  but it has gotten worse in the past couple of weeks with no known injury. She has had pain over the past two years when walking but the pain resolved with rest. Sometimes feels weak in the L leg but has not fallen.   PERTINENT HISTORY:  Anxiety; bladder infections; bladder tack 2018; C-section 1979; osteoporosis   PAIN:  Are you having pain? Yes: NPRS scale: 2/10; best in past week 0/10; worst in past week 6/10  Pain location: across back; L leg -posterior to  lateral calf and anterior shin  Pain description: sharp; groin  Aggravating factors: sitting; moving from sit to stand  Relieving factors: walking; resting   PRECAUTIONS: None   WEIGHT BEARING RESTRICTIONS: No  FALLS:  Has patient fallen in last 6 months? No  LIVING ENVIRONMENT: Lives with: lives alone Lives in: House/apartment Stairs: Yes: External: 3  steps; on left going up Has following equipment at home: Single point cane  OCCUPATION: retired from [employer] - retired ~ 10 years ago some sitting at work but some standing Cooking; cleaning; household chores; yard work; walking daily ~ 30 min inclines; recliner    PATIENT GOALS: get rid of pain and walk without limping   NEXT MD VISIT: 12/05/24  OBJECTIVE:  Note: Objective measures were completed at Evaluation unless otherwise noted.  DIAGNOSTIC FINDINGS:  None for back or hip   PATIENT SURVEYS:  Modified Oswestry Low Back Scale - 13/50; 26%   SENSATION: Some tingling in L LE   MUSCLE LENGTH: Hamstrings: Right 65 deg; Left 60 deg Hip flexor tightness: Right -5 to -10 deg; Left -10 -15 deg  POSTURE: rounded shoulders, forward head, decreased lumbar lordosis, increased thoracic kyphosis, flexed trunk , and weight shift right  PALPATION: Significant tightness in L >> R hip flexors (iliacus and psoas); proximal quads; posterior hip through the piriformis; glut med/min; bilat lumbar paraspinals   LUMBAR ROM: pain with lumbar ROM in all planes   AROM eval   Flexion 70%  Extension 40%  Right lateral flexion 30%  Left lateral flexion 40%  Right rotation 20%  Left rotation 25%   (Blank rows = not tested)  LOWER EXTREMITY ROM:   approximate A-AAROM   Active  Right eval Left eval  Hip flexion 110 105  Hip extension -5 to-10  -10 to -15   Hip abduction    Hip adduction    Hip internal rotation Tight  Tight > than R  Hip external rotation Tight  Tight > than R   Knee flexion WNL's WNL's  Knee extension WNL's  WNL's   Ankle dorsiflexion    Ankle plantarflexion    Ankle inversion    Ankle eversion     (Blank rows = not tested)  LOWER EXTREMITY MMT:  assessed in supine and sidelying   MMT Right eval Left eval  Hip flexion 4/5 Discomfort  Painful AROM  Hip extension    Hip abduction 4 4-  Hip adduction    Hip internal rotation    Hip external rotation    Knee flexion    Knee extension    Ankle dorsiflexion    Ankle plantarflexion    Ankle inversion    Ankle eversion     (Blank rows = not tested)  LUMBAR SPECIAL TESTS:  Straight leg raise test: Negative, Slump test: Negative Hip pain and tightness with L SLR no change in back pain   FUNCTIONAL TESTS:  5 times sit to stand: 18.76 sec    SLS R 3 sec; L 1 sec  GAIT: Distance walked: 40 feet  Assistive device utilized: None Level of assistance: Complete Independence Comments: antalgic gait with significant limp on L LE; L LE flexed at hip and knee; trunk flexed forward; weight shifted to the R    [facility] Adult PT Treatment:  DATE: 08/23/2024 Therapeutic Exercise: Supine:  Assisted hip flexor stretch off edge of table in supine 30 sec x 1; 45 sec x 1  Piriformis stretch with opp leg straight 3 x 30 sec Supported hip adductor stretch (butterfly) 2 x 30 sec HS & ITB stretches with strap 2 x 30 sec each  Manual: Supine   Iliacus release L/R 90 sec  STM/TPR L psoas; hip adductors; quads  Passive adductor stretch pt supine   Myofacial release with L LE working onto IR with hip and leg extended  Prone  STM/TPR L gluts/piriformis  Passive knee flexion stretch 20-30 sec x 3 Windshield wiper hip extended; knee flexed 90 deg for IR/ER ROM  Neuromuscular re-ed: Prone glut set 10 sec x 10  Standing anatomical position back to wall with glut set  Hip adductor stretch with strap 30 sec x 2  Activation of transverse abdominals supine 10 sec hold  Therapeutic Activity: Prone on elbows Prone press up x10 Bridging 5 sec x 10  Bridge with blue TB distal thigh 3 sec x 10  Alternate hip abduction blue TB 3 sec x 10  Standing posture and alignment Walking with VC for alignment and engaging core and gluts  Backward walking with focus on glut activation and hip extension  Ball release standing   [facility] Adult PT Treatment:                                                DATE: 08/21/2024 Therapeutic Exercise: Supine:  Assisted hip flexor stretch off edge of table in supine 30 sec x 1; 45 sec x 1  Piriformis stretch with opp leg straight 3 x 30 sec Supported hip adductor stretch (butterfly) 2 x 30 sec HS & ITB stretches with strap 2 x 30 sec each   Manual: Supine   Iliacus release L 90 sec  STM/TPR L psoas; hip adductors; quads  Passive adductor stretch pt supine  Myofacial release with L LE working onto [employer] with hip and leg extended  Prone  STM/TPR L gluts/piriformis  Passive knee flexion stretch 20-30 sec x 3 Windshield wiper hip extended; knee flexed 90 deg for IR/ER ROM  Neuromuscular re-ed: Prone glut set 10 sec x 10  Standing anatomical position back to wall with glut set  Therapeutic Activity: Prone on elbows Prone press up x10 Standing posture and alignment Walking with VC for alignment and engaging core and gluts  Backward walking with focus on glut activation and hip extension    [facility] Adult PT Treatment:                                                DATE: 08/18/2024 Therapeutic Exercise: Supine:   Assisted hip flexor stretch off edge of table in supine Piriformis stretch with opp leg straight 3 x 30 sec Supported hip adductor stretch (butterfly) 3 x 30 sec Alt HS & ITB stretches with strap 2 x 30 sec each  Neuromuscular re-ed: Bridges x10 --> bridges + green TB around thighs x10 Hooklying clamshell + green TB around thighs 10 x 3 sec  Resisted bent knee fall out + red TB around thighs x10 (bil) Side Lying: Clamshells --> poor glute activation, primarily felt hip  add tension Reverse clamshells 2x10 Therapeutic Activity: Prone on elbows Prone press up x10   [facility] Adult PT Treatment:                                                DATE: 08/16/24 Therapeutic Exercise: Hamstring stretch with strap 30 sec x 3 ITB stretch with strap 30 sec x 3 Piriformis stretch travell supine 30 sec x 3  Hip flexor stretch supine 30 sec x 3 Hip drop in hooklying 15-20 reps  Therapeutic Activity: Sciatic nerve glide supine x 10 R/L Bridging 3 sec x 10  Hip abduction hooklying alternating LE's 3 sec x 10 R/L  Counter plank 30-60 sec x 3   Self Care: Avoid sitting in recliner Avoid crossing legs or ankles  Hold on denise austin exercise video   TREATMENT DATE: view of evaluation; POC; HEP  Avoid sitting wth ankles or knees crossed Avoid recliner or modify recliner                                                                                                                                 PATIENT EDUCATION:  Education details: Updated HEP  Person educated: Patient Education method: Explanation, Demonstration, Tactile cues, Verbal cues, and Handouts Education comprehension: verbalized understanding, returned demonstration, verbal cues required, tactile cues required, and needs further education  HOME EXERCISE PROGRAM: Access Code: [email or web address] URL: [email or web address] Date: 08/23/2024 Prepared by: Conlee Sliter  Exercises - Supine Piriformis Stretch with Leg Straight  - 2 x  daily - 7 x weekly - 1 sets - 3 reps - 30 sec  hold - Bent Knee Fallouts  - 2 x daily - 7 x weekly - 1-2 sets - 10 reps - 2-3 sec  hold - Hooklying Hamstring Stretch with Strap  - 1 x daily - 7 x weekly - 1 sets - 3 reps - 30 sec  hold - Supine ITB Stretch with Strap  - 1 x daily - 7 x weekly - 1 sets - 3 reps - 30 sec  hold - Hip Flexor Stretch at Edge of Bed  - 2 x daily - 7 x weekly - 1 sets - 3 reps - 30 sec  hold - Supine Sciatic Nerve Glide  - 2 x daily - 7 x weekly - 1 sets - 8-10 reps - 1-2 sec  hold - Supine Bridge  - 2 x daily - 7 x weekly - 1-2 sets - 10 reps - 5-10 sec  hold - Hooklying Isometric Clamshell  - 2 x daily - 7 x weekly - 1 sets - 10 reps - 3 sec  hold - Plank on Counter  - 1 x daily - 7 x weekly - 1 sets - 3 reps - 30 sec  hold - Prone Press Up  -  2 x daily - 7 x weekly - 1 sets - 10 reps - Prone Gluteal Sets  - 2 x daily - 7 x weekly - 1 sets - 10 reps - 10 sec  hold - Standing Anatomical Position with Scapular Retraction and Depression at Wall  - 2 x daily - 7 x weekly - 1 sets - 5-10 reps - 5-10 sec  hold - Hip Adductors and Hamstring Stretch with Strap  - 1 x daily - 7 x weekly - 1 sets - 3 reps - 30 sec  hold - Supine Bridge with Resistance Band  - 2 x daily - 7 x weekly - 1-2 sets - 10 reps - 5-10 sec  hold - Standing Piriformis Release with Ball at [employer]  - 2 x daily - 7 x weekly - 30-60 sec  hold  ASSESSMENT:  CLINICAL IMPRESSION: Patient reports excellent improvement in back and L hip pain with manual work from last visit. She has been working on her exercises at home. Persistent but improving tightness noted in hip flexors including iliacus, psoas; quads; adductors on Lt side. Continued manual work including release for iliacus and TRP/deep tissue work through the L hip patient supine and prone. Added adductor stretch and bridge in supine. Worked on gait forward and backward with focus on alignment and slowing down to improve gait pattern and decrease limp on L LE  in weight bearing L with good improvement in gait pattern. Patient will work on gait for home with focus on alignment, glut set, slowing down. Good response to treatment.  Good response to myofacial release and manual work. Continued stretching for L hip; strengthening with focus on gluts; prone press for home program.  Eval: Patient is a 78 y.o. female who was seen today for physical therapy evaluation and treatment for chronic L sided LBP with L sided sciatica. She reports significant increase in symptoms in the past 2 weeks with no known injury. Patient has poor posture and alignment; limited trunk and L > R LE mobility/ROM; decreased strength L > R LE; core weakness; abnormal gait pattern; antalgic gait; muscular tightness to palpation with significant tightness and shortening through the hip flexors L > R; abnormal postures and transfers/transitional movement. Patient will benefit from phyaical therapy to address problems identified.   GOALS: Goals reviewed with patient? Yes  SHORT TERM GOALS: Target date: 09/06/2024  Independent in initial HEP  Baseline: Goal status: INITIAL  2.  Improve tissue extensibility through the lumbar spine and L? R LE's allowing patient to achieve good upright posture in standing  Baseline:  Goal status: INITIAL  3.  Improve gait with patient to report minimal pain with standing and walking > 15-20 minutes  Baseline:  Goal status: INITIAL  4.  Patient reports 50% decrease in pain with moving sit to stand  Baseline:  Goal status: INITIAL   LONG TERM GOALS: Target date: 10/04/2024  Decrease back and L LE pain by 75-90% allowing patient to return to normal functional activities  Baseline:  Goal status: INITIAL  2.  Increase hip extension ROM to neutral bilat to improve functional activity tolerance  Baseline:  Goal status: INITIAL  3.  Increase strength bilat LE's to 4+/5 to 5/5  Baseline:  Goal status: INITIAL  4.  Patient reports minimal to no  pain with transitioning from sit to stand  Baseline:  Goal status: INITIAL  5.  Improve modified Oswestry score by 10%  Baseline: 13/50; 26%  Goal status: INITIAL  6.  Independent in advance HEP  Baseline:  Goal status: INITIAL  PLAN:  PT FREQUENCY: 2x/week  PT DURATION: 8 weeks  PLANNED INTERVENTIONS: 97164- PT Re-evaluation, 97110-Therapeutic exercises, 97530- Therapeutic activity, 97112- Neuromuscular re-education, 97535- Self Care, [id number]- Manual therapy, [phone]- Gait training, [phone]- Electrical stimulation (unattended), Patient/Family education, Balance training, Stair training, Taping, and Joint mobilization.  PLAN FOR NEXT SESSION: review and progress exercises; continue with back care and ergonomic education/correction; manual work and modalities as indicated    Gus Littler P Zaydee Aina, PT 08/23/2024, 1:15 PM

## 2024-08-28 ENCOUNTER — Ambulatory Visit: Admitting: Rehabilitative and Restorative Service Providers"

## 2024-08-28 ENCOUNTER — Encounter: Payer: Self-pay | Admitting: Rehabilitative and Restorative Service Providers"

## 2024-08-28 DIAGNOSIS — R29898 Other symptoms and signs involving the musculoskeletal system: Secondary | ICD-10-CM

## 2024-08-28 DIAGNOSIS — M6281 Muscle weakness (generalized): Secondary | ICD-10-CM

## 2024-08-28 DIAGNOSIS — M5459 Other low back pain: Secondary | ICD-10-CM

## 2024-08-28 DIAGNOSIS — M5432 Sciatica, left side: Secondary | ICD-10-CM

## 2024-08-28 DIAGNOSIS — R2689 Other abnormalities of gait and mobility: Secondary | ICD-10-CM

## 2024-08-28 NOTE — Therapy (Signed)
 OUTPATIENT PHYSICAL THERAPY THORACOLUMBAR TREATMENT   Patient Name: Onedia Vargus MRN: [id number] DOB:[birth year], 78 y.o., female Today's Date: 08/28/2024  END OF SESSION:  PT End of Session - 08/28/24 1105     Visit Number 6    Number of Visits 16    Date for Recertification  10/04/24    Authorization Type medicare and [employer] of [place]    Progress Note Due on Visit 10    PT Start Time 1100    PT Stop Time 1145    PT Time Calculation (min) 45 min    Activity Tolerance Patient tolerated treatment well         Past Medical History:  Diagnosis Date   Anxiety    H/O bladder infections    Past Surgical History:  Procedure Laterality Date   CESAREAN SECTION  1979   Patient Active Problem List   Diagnosis Date Noted   Right elbow pain 09/27/2019   Right shoulder pain 09/27/2019   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   Atrophic vaginitis 01/05/2018   Female stress incontinence 01/05/2018   GAD (generalized anxiety disorder) 02/18/2017   Osteopenia of multiple sites 02/18/2017   Cystocele with rectocele 11/16/2016    PCP: Benton LITTIE Quay, PA  REFERRING PROVIDER: Benton LITTIE Quay, PA  REFERRING DIAG: 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 With discussion patient reports that she was sitting to write Christmas cards and bent forward wrapping gifts. She has had more pain in the L hip and is limping again. She has been working on her  exercises at home but pain persists.   Eval: Patient reports that the L LBP and L LE pain for years but it has gotten worse in the past couple of weeks with no known injury. She has had pain over the past two years when walking but the pain resolved with rest. Sometimes feels weak in the L leg but has not fallen.   PERTINENT HISTORY:  Anxiety; bladder infections; bladder tack 2018; C-section 1979; osteoporosis   PAIN:  Are you having pain? Yes: NPRS scale: 4/10;  best in past week 0/10; worst in past week 6/10  Pain location: across back; L leg -posterior to lateral calf and anterior shin  Pain description: sharp; groin  Aggravating factors: sitting; moving from sit to stand  Relieving factors: walking; resting   PRECAUTIONS: None   WEIGHT BEARING RESTRICTIONS: No  FALLS:  Has patient fallen in last 6 months? No  LIVING ENVIRONMENT: Lives with: lives alone Lives in: House/apartment Stairs: Yes: External: 3  steps; on left going up Has following equipment at home: Single point cane  OCCUPATION: retired from [employer] - retired ~ 10 years ago some sitting at work but some standing Cooking; cleaning; household chores; yard work; walking daily ~ 30 min inclines; recliner    PATIENT GOALS: get rid of pain and walk without limping   NEXT MD VISIT: 12/05/24  OBJECTIVE:  Note: Objective measures were completed at Evaluation unless otherwise noted.  DIAGNOSTIC FINDINGS:  None for back or hip   PATIENT SURVEYS:  Modified Oswestry Low Back Scale - 13/50; 26%   SENSATION: Some tingling in L LE   MUSCLE LENGTH: Hamstrings: Right 65 deg; Left 60 deg Hip flexor tightness: Right -5 to -10 deg; Left -10 -15 deg  POSTURE: rounded shoulders, forward head, decreased lumbar lordosis, increased thoracic kyphosis, flexed trunk , and weight shift right  PALPATION: Significant tightness in L >> R hip flexors (iliacus and psoas); proximal quads; posterior hip through the piriformis; glut  med/min; bilat lumbar paraspinals   LUMBAR ROM: pain with lumbar ROM in all planes   AROM eval  Flexion 70%  Extension 40%  Right lateral flexion 30%  Left lateral flexion 40%  Right rotation 20%  Left rotation 25%   (Blank rows = not tested)  LOWER EXTREMITY ROM:   approximate A-AAROM   Active  Right eval Left eval  Hip flexion 110 105  Hip extension -5 to-10  -10 to -15   Hip abduction    Hip adduction    Hip internal rotation Tight  Tight > than R  Hip external rotation Tight  Tight > than R   Knee flexion WNL's WNL's  Knee extension WNL's  WNL's   Ankle dorsiflexion    Ankle plantarflexion    Ankle inversion    Ankle eversion     (Blank rows = not tested)  LOWER EXTREMITY MMT:  assessed in supine and sidelying   MMT Right eval Left eval  Hip flexion 4/5 Discomfort  Painful AROM  Hip extension    Hip abduction 4 4-  Hip adduction    Hip internal rotation    Hip external rotation    Knee flexion    Knee extension    Ankle dorsiflexion    Ankle plantarflexion    Ankle inversion    Ankle eversion     (Blank rows = not tested)  LUMBAR SPECIAL TESTS:  Straight leg raise test: Negative, Slump test: Negative Hip pain and tightness with L SLR no change in back pain   FUNCTIONAL TESTS:  5 times sit to stand: 18.76 sec    SLS R 3 sec; L 1 sec  GAIT: Distance walked: 40 feet  Assistive device utilized: None Level of assistance: Complete Independence Comments: antalgic gait with significant limp on L LE; L LE flexed at hip and knee; trunk flexed forward; weight shifted to the R     [facility] Adult PT Treatment:  DATE: 08/28/2024 Therapeutic Exercise: Supine:  Assisted hip flexor stretch off edge of table in supine 30 sec x 2; 45 sec x 1  Piriformis stretch with opp leg straight 3 x 30 sec Supported hip adductor stretch (butterfly R- extended leg L) 2 x 30 sec HS & ITB stretches with strap 2 x 30 sec each  (HEP) Manual: Supine   Iliacus release L/R 90 sec  STM/TPR L psoas; hip adductors; quads  Passive adductor stretch pt supine  Myofacial release with L LE working onto IR with hip and leg extended  Prone  STM/TPR L gluts/piriformis  Passive knee flexion stretch 20-30 sec x 3 Windshield wiper hip extended; knee flexed 90 deg for IR/ER ROM  Neuromuscular re-ed: Prone glut set 10 sec x 10  Standing anatomical position back to wall with glut set  Hip adductor stretch with PT assist 30 sec x 3  Activation of transverse abdominals supine 10 sec hold  Therapeutic Activity: Prone on elbows Prone press up x10 (HEP) Bridging 5 sec x 10  Bridge with blue TB distal thigh 3 sec x 10 (HEP) Alternate hip abduction blue TB 3 sec x 10 (HEP) Standing posture and alignment Backward walking with focus on glut activation and hip extension (HEP) Ball release prone and supine - standing for home     [facility] Adult PT Treatment:                                                DATE: 08/23/2024 Therapeutic Exercise: Supine:  Assisted hip flexor stretch off edge of table in supine 30 sec x 1; 45 sec x 1  Piriformis stretch with opp leg straight 3 x 30 sec Supported hip adductor stretch (butterfly) 2 x 30 sec HS & ITB stretches with strap 2 x 30 sec each  Manual: Supine   Iliacus release L/R 90 sec  STM/TPR L psoas; hip adductors; quads  Passive adductor stretch pt supine  Myofacial release with L LE working onto IR with hip and leg extended  Prone  STM/TPR L gluts/piriformis  Passive knee flexion stretch 20-30 sec x 3 Windshield wiper hip extended; knee flexed 90 deg for IR/ER ROM  Neuromuscular re-ed: Prone glut set 10 sec x 10  Standing anatomical position back to wall with glut set  Hip adductor stretch with strap 30 sec x 2  Activation of transverse abdominals supine 10 sec hold  Therapeutic Activity: Prone on elbows Prone press up x10 Bridging 5 sec x 10  Bridge with blue TB distal thigh 3  sec x 10  Alternate hip abduction blue TB 3 sec x 10  Standing posture and alignment Walking with VC for alignment and engaging core and gluts  Backward walking with focus on glut activation and hip extension  Ball release standing   TREATMENT DATE: view of evaluation; POC; HEP  Avoid sitting wth ankles or knees crossed Avoid recliner or modify recliner  PATIENT EDUCATION:  Education details: Updated HEP  Person educated: Patient Education method: Explanation, Demonstration, Tactile cues, Verbal cues, and Handouts Education comprehension: verbalized understanding, returned demonstration, verbal cues required, tactile cues required, and needs further education  HOME EXERCISE PROGRAM: Access Code: [email or web address] URL: [email or web address] Date: 08/23/2024 Prepared by: Edelmira Gallogly  Exercises - Supine Piriformis Stretch with Leg Straight  - 2 x daily - 7 x weekly - 1 sets - 3 reps - 30 sec  hold - Bent Knee Fallouts  - 2 x daily - 7 x weekly - 1-2 sets - 10 reps - 2-3 sec  hold - Hooklying Hamstring Stretch with Strap  - 1 x daily - 7 x weekly - 1 sets - 3 reps - 30 sec  hold - Supine ITB Stretch with Strap  - 1 x daily - 7 x weekly - 1 sets - 3 reps - 30 sec  hold - Hip Flexor Stretch at Edge of Bed  - 2 x daily - 7 x weekly - 1 sets - 3 reps - 30 sec  hold - Supine Sciatic Nerve Glide  - 2 x daily - 7 x weekly - 1 sets - 8-10 reps - 1-2 sec  hold - Supine Bridge  - 2 x daily - 7 x weekly - 1-2 sets - 10 reps - 5-10 sec  hold - Hooklying Isometric Clamshell  - 2 x daily - 7 x weekly - 1 sets - 10 reps - 3 sec  hold - Plank on Counter  - 1 x daily - 7 x weekly - 1 sets - 3 reps - 30 sec  hold - Prone Press Up  - 2 x daily - 7 x weekly - 1 sets - 10 reps - Prone Gluteal Sets  - 2 x daily - 7 x weekly - 1 sets - 10 reps - 10 sec  hold - Standing  Anatomical Position with Scapular Retraction and Depression at Wall  - 2 x daily - 7 x weekly - 1 sets - 5-10 reps - 5-10 sec  hold - Hip Adductors and Hamstring Stretch with Strap  - 1 x daily - 7 x weekly - 1 sets - 3 reps - 30 sec  hold - Supine Bridge with Resistance Band  - 2 x daily - 7 x weekly - 1-2 sets - 10 reps - 5-10 sec  hold - Standing Piriformis Release with Ball at Wall  - 2 x daily - 7 x weekly - 30-60 sec  hold  ASSESSMENT:  CLINICAL IMPRESSION: Patient reports flare up of L hip pain over the weekend. She verbalizes that she understands what probably irritated symptoms including more sitting and bent forward activities). Noted increased tightness in hip flexors including iliacus, psoas; quads; adductors on Lt side. Continued manual work including release for iliacus and TRP/deep tissue work through the L hip patient supine and prone. Worked on gait forward and backward with focus on alignment and slowing down to improve gait pattern and decrease limp on L LE in weight bearing L with good improvement in gait pattern. Patient will work on gait for home with focus on alignment, glut set, slowing down. Good response to treatment.  Good response to myofacial release and manual work. Continued stretching for L hip; strengthening with focus on gluts; prone press for home program. No new exercises today. Less pain and tightness noted following treatment.   Eval: Patient is a 78 y.o. female who was seen today for physical therapy  evaluation and treatment for chronic L sided LBP with L sided sciatica. She reports significant increase in symptoms in the past 2 weeks with no known injury. Patient has poor posture and alignment; limited trunk and L > R LE mobility/ROM; decreased strength L > R LE; core weakness; abnormal gait pattern; antalgic gait; muscular tightness to palpation with significant tightness and shortening through the hip flexors L > R; abnormal postures and transfers/transitional  movement. Patient will benefit from phyaical therapy to address problems identified.   GOALS: Goals reviewed with patient? Yes  SHORT TERM GOALS: Target date: 09/06/2024  Independent in initial HEP  Baseline: Goal status: INITIAL  2.  Improve tissue extensibility through the lumbar spine and L? R LE's allowing patient to achieve good upright posture in standing  Baseline:  Goal status: INITIAL  3.  Improve gait with patient to report minimal pain with standing and walking > 15-20 minutes  Baseline:  Goal status: INITIAL  4.  Patient reports 50% decrease in pain with moving sit to stand  Baseline:  Goal status: INITIAL   LONG TERM GOALS: Target date: 10/04/2024  Decrease back and L LE pain by 75-90% allowing patient to return to normal functional activities  Baseline:  Goal status: INITIAL  2.  Increase hip extension ROM to neutral bilat to improve functional activity tolerance  Baseline:  Goal status: INITIAL  3.  Increase strength bilat LE's to 4+/5 to 5/5  Baseline:  Goal status: INITIAL  4.  Patient reports minimal to no pain with transitioning from sit to stand  Baseline:  Goal status: INITIAL  5.  Improve modified Oswestry score by 10%  Baseline: 13/50; 26%  Goal status: INITIAL  6.  Independent in advance HEP  Baseline:  Goal status: INITIAL  PLAN:  PT FREQUENCY: 2x/week  PT DURATION: 8 weeks  PLANNED INTERVENTIONS: 97164- PT Re-evaluation, 97110-Therapeutic exercises, 97530- Therapeutic activity, 97112- Neuromuscular re-education, 97535- Self Care, [id number]- Manual therapy, [phone]- Gait training, [phone]- Electrical stimulation (unattended), Patient/Family education, Balance training, Stair training, Taping, and Joint mobilization.  PLAN FOR NEXT SESSION: review and progress exercises; continue with back care and ergonomic education/correction; manual work and modalities as indicated    Doyal Saric P Anona Giovannini, PT 08/28/2024, 11:13 AM

## 2024-08-30 ENCOUNTER — Ambulatory Visit: Admitting: Rehabilitative and Restorative Service Providers"

## 2024-08-30 ENCOUNTER — Encounter: Payer: Self-pay | Admitting: Rehabilitative and Restorative Service Providers"

## 2024-08-30 DIAGNOSIS — M5459 Other low back pain: Secondary | ICD-10-CM

## 2024-08-30 DIAGNOSIS — R2689 Other abnormalities of gait and mobility: Secondary | ICD-10-CM

## 2024-08-30 DIAGNOSIS — R29898 Other symptoms and signs involving the musculoskeletal system: Secondary | ICD-10-CM

## 2024-08-30 DIAGNOSIS — M5432 Sciatica, left side: Secondary | ICD-10-CM

## 2024-08-30 DIAGNOSIS — M6281 Muscle weakness (generalized): Secondary | ICD-10-CM

## 2024-08-30 NOTE — Therapy (Signed)
 OUTPATIENT PHYSICAL THERAPY THORACOLUMBAR TREATMENT   Patient Name: Danielle Parsons MRN: [id number] DOB:[birth year], 78 y.o., female Today's Date: 08/30/2024  END OF SESSION:  PT End of Session - 08/30/24 1100     Visit Number 7    Number of Visits 16    Date for Recertification  10/04/24    Authorization Type medicare and mutual of [place]    Progress Note Due on Visit 10    PT Start Time 1100    PT Stop Time 1150    PT Time Calculation (min) 50 min    Activity Tolerance Patient tolerated treatment well         Past Medical History:  Diagnosis Date   Anxiety    H/O bladder infections    Past Surgical History:  Procedure Laterality Date   CESAREAN SECTION  1979   Patient Active Problem List   Diagnosis Date Noted   Right elbow pain 09/27/2019   Right shoulder pain 09/27/2019   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   Atrophic vaginitis 01/05/2018   Female stress incontinence 01/05/2018   GAD (generalized anxiety disorder) 02/18/2017   Osteopenia of multiple sites 02/18/2017   Cystocele with rectocele 11/16/2016    PCP: Benton LITTIE Quay, PA  REFERRING PROVIDER: Benton LITTIE Quay, PA  REFERRING DIAG: 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 She has some continued pain in the the L hip and leg but it resolves with standing/walking. She is sitting less at at home - trying to avoid bent forward. Has a new recliner  but does not plan to use it as recliner. She has been working on her exercises at home.   Eval: Patient reports that the L LBP and L LE pain for years but it has gotten worse in the past couple of weeks with no known injury. She has had pain over the past two years when walking but the pain resolved with rest. Sometimes feels weak in the L leg but has not fallen.   PERTINENT HISTORY:  Anxiety; bladder infections; bladder tack 2018; C-section  1979; osteoporosis   PAIN:  Are you having pain? Yes: NPRS scale: 0/10; best in past week 0/10; worst in past week 6/10  Pain location: across back; L leg -posterior to lateral calf and anterior shin  Pain description: sharp; groin  Aggravating factors: sitting; moving from sit to stand  Relieving factors: walking; resting   PRECAUTIONS: None   WEIGHT BEARING RESTRICTIONS: No  FALLS:  Has patient fallen in last 6 months? No  LIVING ENVIRONMENT: Lives with: lives alone Lives in: House/apartment Stairs: Yes: External: 3  steps; on left going up Has following equipment at home: Single point cane  OCCUPATION: retired from [employer] - retired ~ 10 years ago some sitting at work but some standing Cooking; cleaning; household chores; yard work; walking daily ~ 30 min inclines; recliner    PATIENT GOALS: get rid of pain and walk without limping   NEXT MD VISIT: 12/05/24  OBJECTIVE:  Note: Objective measures were completed at Evaluation unless otherwise noted.  DIAGNOSTIC FINDINGS:  None for back or hip   PATIENT SURVEYS:  Modified Oswestry Low Back Scale - 13/50; 26%   SENSATION: Some tingling in L LE   MUSCLE LENGTH: Hamstrings: Right 65 deg; Left 60 deg Hip flexor tightness: Right -5 to -10 deg; Left -10 -15 deg  POSTURE: rounded shoulders, forward head, decreased lumbar lordosis, increased thoracic kyphosis, flexed trunk , and weight shift right  PALPATION: Significant tightness in L >> R hip flexors (iliacus and psoas); proximal  quads; posterior hip through the piriformis; glut med/min; bilat lumbar paraspinals   LUMBAR ROM: pain with lumbar ROM in all planes   AROM eval  Flexion 70%  Extension 40%  Right lateral flexion 30%  Left lateral flexion 40%  Right rotation 20%  Left rotation 25%   (Blank rows = not tested)  LOWER EXTREMITY ROM:   approximate A-AAROM   Active  Right eval Left eval  Hip flexion 110 105  Hip extension -5 to-10  -10 to -15   Hip abduction    Hip adduction    Hip internal rotation Tight  Tight > than R  Hip external rotation Tight  Tight > than R   Knee flexion WNL's WNL's  Knee extension WNL's  WNL's   Ankle dorsiflexion    Ankle plantarflexion    Ankle inversion    Ankle eversion     (Blank rows = not tested)  LOWER EXTREMITY MMT:  assessed in supine and sidelying   MMT Right eval Left eval  Hip flexion 4/5 Discomfort  Painful AROM  Hip extension    Hip abduction 4 4-  Hip adduction    Hip internal rotation    Hip external rotation    Knee flexion    Knee extension    Ankle dorsiflexion    Ankle plantarflexion    Ankle inversion    Ankle eversion     (Blank rows = not tested)  LUMBAR SPECIAL TESTS:  Straight leg raise test: Negative, Slump test: Negative Hip pain and tightness with L SLR no change in back pain   FUNCTIONAL TESTS:  5 times sit to stand: 18.76 sec    SLS R 3 sec; L 1 sec  GAIT: Distance walked: 40 feet  Assistive device utilized: None Level of assistance: Complete Independence Comments: antalgic gait with significant limp on L LE; L LE flexed at hip and knee; trunk flexed forward; weight shifted to the R     [facility] Adult PT Treatment:  DATE: 08/30/2024 Therapeutic Exercise: Supine:  Assisted hip flexor stretch off edge of table in supine 30 sec x 2; 45 sec x 1  Piriformis stretch with opp leg straight 3 x 30 sec Supported hip adductor stretch with PT assist 3 x 30 sec HS & ITB stretches  with strap 2 x 30 sec each (HEP) Manual: Supine   Iliacus release L/R 90 sec  STM/TPR L psoas; hip adductors; quads  Passive adductor stretch pt supine with PT assist 30 sec x 3  Myofacial release with L LE working onto IR with hip and leg extended  Prone  STM/TPR L gluts/piriformis  Passive knee flexion stretch 20-30 sec x 3 Windshield wiper hip extended; knee flexed 90 deg for IR/ER ROM with contract relax for ER Traction through L LElong axis Neuromuscular re-ed: Prone glut set 10 sec x 10  Standing anatomical position back to wall with glut set  Partial activation of core with pelvic floor and transverse abdominals supine 10 sec hold trial of activating multifidi which overworks and decreases TA activation  Therapeutic Activity: Prone on elbows Prone press up x10 (HEP) Bridging 5 sec x 10  Bridge with blue TB distal thigh 3 sec x 10 (HEP) Alternate hip abduction blue TB 3 sec x 10 (HEP) Standing posture and alignment Backward walking with focus on glut activation and hip extension (HEP) Ball release prone and supine - standing for home      [facility] Adult PT Treatment:                                                DATE: 08/28/2024 Therapeutic Exercise: Supine:  Assisted hip flexor stretch off edge of table in supine 30 sec x 2; 45 sec x 1  Piriformis stretch with opp leg straight 3 x 30 sec Supported hip adductor stretch (butterfly R- extended leg L) 2 x 30 sec HS & ITB stretches with strap 2 x 30 sec each (HEP) Manual: Supine   Iliacus release L/R 90 sec  STM/TPR L psoas; hip adductors; quads  Passive adductor stretch pt supine  Myofacial release with L LE working onto IR with hip and leg extended  Prone  STM/TPR L gluts/piriformis  Passive knee flexion stretch 20-30 sec x 3 Windshield wiper hip extended; knee flexed 90 deg for IR/ER ROM  Neuromuscular re-ed: Prone glut set 10 sec x 10  Standing anatomical position back to wall with glut set  Hip adductor stretch  with PT assist 30 sec x 3  Activation of transverse abdominals supine 10 sec hold  Therapeutic Activity: Prone on elbows Prone press up x10 (HEP) Bridging 5 sec x 10  Bridge with blue TB distal thigh 3 sec x 10 (HEP) Alternate hip abduction blue TB 3 sec x 10 (HEP) Standing posture and alignment Backward walking with focus on glut activation and hip extension (HEP) Ball release prone and supine - standing for home     [facility] Adult PT Treatment:                                                DATE: 08/23/2024 Therapeutic Exercise: Supine:  Assisted hip flexor stretch off edge of table in supine 30 sec x 1;  45 sec x 1  Piriformis stretch with opp leg straight 3 x 30 sec Supported hip adductor stretch (butterfly) 2 x 30 sec HS & ITB stretches with strap 2 x 30 sec each  Manual: Supine   Iliacus release L/R 90 sec  STM/TPR L psoas; hip adductors; quads  Passive adductor stretch pt supine  Myofacial release with L LE working onto IR with hip and leg extended  Prone  STM/TPR L gluts/piriformis  Passive knee flexion stretch 20-30 sec x 3 Windshield wiper hip extended; knee flexed 90 deg for IR/ER ROM  Neuromuscular re-ed: Prone glut set 10 sec x 10  Standing anatomical position back to wall with glut set  Hip adductor stretch with strap 30 sec x 2  Activation of transverse abdominals supine 10 sec hold  Therapeutic Activity: Prone on elbows Prone press up x10 Bridging 5 sec x 10  Bridge with blue TB distal thigh 3 sec x 10  Alternate hip abduction blue TB 3 sec x 10  Standing posture and alignment Walking with VC for alignment and engaging core and gluts  Backward walking with focus on glut activation and hip extension  Ball release standing   TREATMENT DATE: view of evaluation; POC; HEP  Avoid sitting wth ankles or knees crossed Avoid recliner or modify recliner                                                                                                                                  PATIENT EDUCATION:  Education details: Updated HEP  Person educated: Patient Education method: Explanation, Demonstration, Tactile cues, Verbal cues, and Handouts Education comprehension: verbalized understanding, returned demonstration, verbal cues required, tactile cues required, and needs further education  HOME EXERCISE PROGRAM: Access Code: [email or web address] URL: [email or web address] Date: 08/30/2024 Prepared by: Angelo Caroll  Exercises - Supine Piriformis Stretch with Leg Straight  - 2 x daily - 7 x weekly - 1 sets - 3 reps - 30 sec  hold - Bent Knee Fallouts  - 2 x daily - 7 x weekly - 1-2 sets - 10 reps - 2-3 sec  hold - Hooklying Hamstring Stretch with Strap  - 1 x daily - 7 x weekly - 1 sets - 3 reps - 30 sec  hold - Supine ITB Stretch with Strap  - 1 x daily - 7 x weekly - 1 sets - 3 reps - 30 sec  hold - Hip Flexor Stretch at Edge of Bed  - 2 x daily - 7 x weekly - 1 sets - 3 reps - 30 sec  hold - Supine Sciatic Nerve Glide  - 2 x daily - 7 x weekly - 1 sets - 8-10 reps - 1-2 sec  hold - Supine Bridge  - 2 x daily - 7 x weekly - 1-2 sets - 10 reps - 5-10 sec  hold - Hooklying Isometric Clamshell  - 2 x daily - 7 x weekly -  1 sets - 10 reps - 3 sec  hold - Plank on Counter  - 1 x daily - 7 x weekly - 1 sets - 3 reps - 30 sec  hold - Prone Press Up  - 2 x daily - 7 x weekly - 1 sets - 10 reps - Prone Gluteal Sets  - 2 x daily - 7 x weekly - 1 sets - 10 reps - 10 sec  hold - Standing Anatomical Position with Scapular Retraction and Depression at Wall  - 2 x daily - 7 x weekly - 1 sets - 5-10 reps - 5-10 sec  hold - Hip Adductors and Hamstring Stretch with Strap  - 1 x daily - 7 x weekly - 1 sets - 3 reps - 30 sec  hold - Supine Bridge with Resistance Band  - 2 x daily - 7 x weekly - 1-2 sets - 10 reps - 5-10 sec  hold - Standing Piriformis Release with Ball at [employer]  - 2 x daily - 7 x weekly - 30-60 sec  hold - Supine Transversus Abdominis Bracing with Pelvic  Floor Contraction  - 2 x daily - 7 x weekly - 1 sets - 10 reps - 10sec  hold  ASSESSMENT:  CLINICAL IMPRESSION: Patient reports and demonstrates partial resolution of the flare up of L hip pain over the weekend. Noted some improvement in tightness in hip flexors including iliacus, psoas; quads; adductors on Lt side. Continued manual work including release for iliacus and TRP/deep tissue work through the L hip patient supine and prone. Odetta will continue to worked on gait forward and backward with focus on alignment and slowing down to improve gait pattern and decrease limp on L LE in weight bearing L. Good response to treatment. Good response to myofacial release and manual work. Continued stretching for L hip; strengthening with focus on gluts; prone press for home program. Less pain and tightness noted following treatment. Added core activation -patient has difficulty with co-contraction for pelvic floor, transverse abdominals, multifidi. Will focus on pelvic floor and TA - adding multifidi as technique improves.   Eval: Patient is a 78 y.o. female who was seen today for physical therapy evaluation and treatment for chronic L sided LBP with L sided sciatica. She reports significant increase in symptoms in the past 2 weeks with no known injury. Patient has poor posture and alignment; limited trunk and L > R LE mobility/ROM; decreased strength L > R LE; core weakness; abnormal gait pattern; antalgic gait; muscular tightness to palpation with significant tightness and shortening through the hip flexors L > R; abnormal postures and transfers/transitional movement. Patient will benefit from phyaical therapy to address problems identified.   GOALS: Goals reviewed with patient? Yes  SHORT TERM GOALS: Target date: 09/06/2024  Independent in initial HEP  Baseline: Goal status: INITIAL  2.  Improve tissue extensibility through the lumbar spine and L? R LE's allowing patient to achieve good upright posture  in standing  Baseline:  Goal status: INITIAL  3.  Improve gait with patient to report minimal pain with standing and walking > 15-20 minutes  Baseline:  Goal status: INITIAL  4.  Patient reports 50% decrease in pain with moving sit to stand  Baseline:  Goal status: INITIAL   LONG TERM GOALS: Target date: 10/04/2024  Decrease back and L LE pain by 75-90% allowing patient to return to normal functional activities  Baseline:  Goal status: INITIAL  2.  Increase hip extension ROM  to neutral bilat to improve functional activity tolerance  Baseline:  Goal status: INITIAL  3.  Increase strength bilat LE's to 4+/5 to 5/5  Baseline:  Goal status: INITIAL  4.  Patient reports minimal to no pain with transitioning from sit to stand  Baseline:  Goal status: INITIAL  5.  Improve modified Oswestry score by 10%  Baseline: 13/50; 26%  Goal status: INITIAL  6.  Independent in advance HEP  Baseline:  Goal status: INITIAL  PLAN:  PT FREQUENCY: 2x/week  PT DURATION: 8 weeks  PLANNED INTERVENTIONS: 97164- PT Re-evaluation, 97110-Therapeutic exercises, 97530- Therapeutic activity, 97112- Neuromuscular re-education, 97535- Self Care, [id number]- Manual therapy, [phone]- Gait training, [phone]- Electrical stimulation (unattended), Patient/Family education, Balance training, Stair training, Taping, and Joint mobilization.  PLAN FOR NEXT SESSION: review and progress exercises; continue with back care and ergonomic education/correction; manual work and modalities as indicated    Omayra Tulloch P Ireanna Finlayson, PT 08/30/2024, 11:56 AM

## 2024-09-04 ENCOUNTER — Encounter: Payer: Self-pay | Admitting: Rehabilitative and Restorative Service Providers"

## 2024-09-04 ENCOUNTER — Ambulatory Visit: Admitting: Rehabilitative and Restorative Service Providers"

## 2024-09-04 DIAGNOSIS — M5459 Other low back pain: Secondary | ICD-10-CM

## 2024-09-04 DIAGNOSIS — M5432 Sciatica, left side: Secondary | ICD-10-CM

## 2024-09-04 DIAGNOSIS — R29898 Other symptoms and signs involving the musculoskeletal system: Secondary | ICD-10-CM

## 2024-09-04 DIAGNOSIS — R2689 Other abnormalities of gait and mobility: Secondary | ICD-10-CM

## 2024-09-04 DIAGNOSIS — M6281 Muscle weakness (generalized): Secondary | ICD-10-CM

## 2024-09-04 NOTE — Therapy (Signed)
 " OUTPATIENT PHYSICAL THERAPY THORACOLUMBAR TREATMENT   Patient Name: Danielle Parsons MRN: [id number] DOB:10/24/1945, 78 y.o., female Today's Date: 09/04/2024  END OF SESSION:  PT End of Session - 09/04/24 1103     Visit Number 8    Number of Visits 16    Date for Recertification  10/04/24    Authorization Type medicare and mutual of [place]    Progress Note Due on Visit 10    PT Start Time 1100    PT Stop Time 1145    PT Time Calculation (min) 45 min    Activity Tolerance Patient tolerated treatment well         Past Medical History:  Diagnosis Date   Anxiety    H/O bladder infections    Past Surgical History:  Procedure Laterality Date   CESAREAN SECTION  1979   Patient Active Problem List   Diagnosis Date Noted   Right elbow pain 09/27/2019   Right shoulder pain 09/27/2019   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   Atrophic vaginitis 01/05/2018   Female stress incontinence 01/05/2018   GAD (generalized anxiety disorder) 02/18/2017   Osteopenia of multiple sites 02/18/2017   Cystocele with rectocele 11/16/2016    PCP: Benton LITTIE Quay, PA  REFERRING PROVIDER: Benton LITTIE Quay, PA  REFERRING DIAG: 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 She has been busy with baking and cooking. She has 12 people staying at her house. She has not had time for her exercises. She has continued pain in the the L hip. She is limping  again but walking improves when she has been standing/walking for a minute of so. She is sitting less at at home and trying to tighten her core when she is walking. Dealing with plumbing problems today.   Eval: Patient reports that the L LBP and L LE pain for years but it has gotten worse in the past couple of weeks with no known injury. She has had pain over the past two years when walking but the pain resolved with rest. Sometimes  feels weak in the L leg but has not fallen.   PERTINENT HISTORY:  Anxiety; bladder infections; bladder tack 2018; C-section 1979; osteoporosis   PAIN:  Are you having pain? Yes: NPRS scale: 2/10; best in past week 0/10; worst in past week 6/10  Pain location: across back; L leg -posterior to lateral thigh, calf and anterior shin  Pain description: not as sharp in groin  Aggravating factors: sitting; moving from sit to stand  Relieving factors: walking; resting   PRECAUTIONS: None   WEIGHT BEARING RESTRICTIONS: No  FALLS:  Has patient fallen in last 6 months? No  LIVING ENVIRONMENT: Lives with: lives alone Lives in: House/apartment Stairs: Yes: External: 3  steps; on left going up Has following equipment at home: Single point cane  OCCUPATION: retired from [employer] - retired ~ 10 years ago some sitting at work but some standing Cooking; cleaning; household chores; yard work; walking daily ~ 30 min inclines; recliner    PATIENT GOALS: get rid of pain and walk without limping   NEXT MD VISIT: 12/05/24  OBJECTIVE:  Note: Objective measures were completed at Evaluation unless otherwise noted.  DIAGNOSTIC FINDINGS:  None for back or hip   PATIENT SURVEYS:  Modified Oswestry Low Back Scale - 13/50; 26%   SENSATION: Some tingling in L LE   MUSCLE LENGTH: Hamstrings: Right 65 deg; Left 60 deg Hip flexor tightness: Right -5 to -10 deg; Left -10 -15 deg  POSTURE: rounded shoulders, forward head, decreased lumbar lordosis, increased thoracic  kyphosis, flexed trunk , and weight shift right  PALPATION: Significant tightness in L >> R hip flexors (iliacus and psoas); proximal quads; posterior hip through the piriformis; glut med/min; bilat lumbar paraspinals   LUMBAR ROM: pain with lumbar ROM in all planes   AROM eval  Flexion 70%  Extension 40%  Right lateral flexion 30%  Left lateral flexion 40%  Right rotation 20%  Left rotation 25%   (Blank rows = not tested)  LOWER EXTREMITY ROM:   approximate A-AAROM   Active  Right eval Left eval  Hip flexion 110 105  Hip extension -5 to-10  -10 to -15   Hip abduction    Hip adduction    Hip internal rotation Tight  Tight > than R  Hip external rotation Tight  Tight > than R   Knee flexion WNL's WNL's  Knee extension WNL's  WNL's   Ankle dorsiflexion    Ankle plantarflexion    Ankle inversion    Ankle eversion     (Blank rows = not tested)  LOWER EXTREMITY MMT:  assessed in supine and sidelying   MMT Right eval Left eval  Hip flexion 4/5 Discomfort  Painful AROM  Hip extension    Hip abduction 4 4-  Hip adduction    Hip internal rotation    Hip external rotation    Knee flexion    Knee extension    Ankle dorsiflexion    Ankle plantarflexion    Ankle inversion    Ankle eversion     (Blank rows = not tested)  LUMBAR SPECIAL TESTS:  Straight leg raise test: Negative, Slump test: Negative Hip pain and tightness with L SLR no change in back pain   FUNCTIONAL TESTS:  5 times sit to stand: 18.76 sec    SLS R 3 sec; L 1 sec  GAIT: Distance walked: 40 feet  Assistive device utilized: None Level of assistance: Complete Independence Comments: antalgic gait with significant limp on L  LE; L LE flexed at hip and knee; trunk flexed forward; weight shifted to the R     [facility] Adult PT Treatment:                                                DATE: 09/04/2024 Therapeutic Exercise: Supine:  Assisted hip flexor stretch off edge of table in supine 30 sec x 3   Piriformis stretch with opp leg straight 3 x 30 sec Hip adductor stretch with with strap 3 x 30 sec HS & ITB stretches with strap 2 x 30 sec each  Manual: Supine   Iliacus release L/R 90 sec  STM/TPR L psoas; hip adductors; quads  Passive adductor stretch pt supine with PT assist 30 sec x 3  Myofacial release with L LE working onto IR with hip and leg extended  Prone  STM/TPR L gluts/piriformis  Passive knee flexion stretch 20-30 sec x 3 Windshield wiper hip extended; knee flexed 90 deg for IR/ER ROM with contract relax for ER Traction through L LElong axis Neuromuscular re-ed: Prone glut set 10 sec x 10  Standing anatomical position back to wall with glut set  Activation of core with pelvic floor and transverse abdominals supine 10 sec hold  Therapeutic Activity: Prone on elbows Prone press up x10  Bridging 5 sec x 10  Bridge with blue TB distal thigh 3 sec x 10  Alternate hip abduction blue TB 3 sec x 10  Standing posture and alignment - posterior hips against counter leaning back to stretch hips and back.  Backward and forward walking with focus on glut activation and hip extension  Ball release prone and supine - standing for home     [facility] Adult PT Treatment:                                                DATE: 08/30/2024 Therapeutic Exercise: Supine:  Assisted hip flexor stretch off edge of table in supine 30 sec x 2; 45 sec x 1  Piriformis stretch with opp leg straight 3 x 30 sec Supported hip adductor stretch with PT assist 3 x 30 sec HS & ITB stretches with strap 2 x 30 sec each (HEP) Manual: Supine   Iliacus release L/R 90 sec  STM/TPR L psoas; hip adductors; quads  Passive adductor stretch pt supine with PT assist 30 sec x 3  Myofacial release with L LE working onto IR with hip and leg extended  Prone  STM/TPR L gluts/piriformis  Passive knee flexion stretch 20-30 sec x 3 Windshield wiper hip extended; knee flexed 90 deg for IR/ER ROM with contract relax for  ER Traction through L LElong axis Neuromuscular re-ed: Prone glut set 10 sec x 10  Standing anatomical position back to wall with glut set  Partial activation of core with pelvic floor and transverse abdominals supine 10 sec hold trial of activating multifidi which overworks and decreases TA activation  Therapeutic Activity: Prone on elbows Prone press up x10 (HEP) Bridging 5 sec x 10  Bridge with blue TB distal thigh 3 sec x 10 (HEP) Alternate hip abduction blue TB 3 sec x 10 (HEP) Standing posture and alignment Backward walking with focus on glut activation  and hip extension (HEP) Ball release prone and supine - standing for home      [facility] Adult PT Treatment:                                                DATE: 08/28/2024 Therapeutic Exercise: Supine:  Assisted hip flexor stretch off edge of table in supine 30 sec x 2; 45 sec x 1  Piriformis stretch with opp leg straight 3 x 30 sec Supported hip adductor stretch (butterfly R- extended leg L) 2 x 30 sec HS & ITB stretches with strap 2 x 30 sec each (HEP) Manual: Supine   Iliacus release L/R 90 sec  STM/TPR L psoas; hip adductors; quads  Passive adductor stretch pt supine  Myofacial release with L LE working onto IR with hip and leg extended  Prone  STM/TPR L gluts/piriformis  Passive knee flexion stretch 20-30 sec x 3 Windshield wiper hip extended; knee flexed 90 deg for IR/ER ROM  Neuromuscular re-ed: Prone glut set 10 sec x 10  Standing anatomical position back to wall with glut set  Hip adductor stretch with PT assist 30 sec x 3  Activation of transverse abdominals supine 10 sec hold  Therapeutic Activity: Prone on elbows Prone press up x10 (HEP) Bridging 5 sec x 10  Bridge with blue TB distal thigh 3 sec x 10 (HEP) Alternate hip abduction blue TB 3 sec x 10 (HEP) Standing posture and alignment Backward walking with focus on glut activation and hip extension (HEP) Ball release prone and supine - standing for home                                                                                                                        PATIENT EDUCATION:  Education details: Updated HEP  Person educated: Patient Education method: Programmer, Multimedia, Demonstration, Tactile cues, Verbal cues, and Handouts Education comprehension: verbalized understanding, returned demonstration, verbal cues required, tactile cues required, and needs further education  HOME EXERCISE PROGRAM: Access Code: [email or web address] URL: [email or web address] Date: 09/04/2024 Prepared by: Amyrah Pinkhasov  Exercises - Supine Piriformis Stretch with Leg Straight  - 2 x daily - 7 x weekly - 1 sets - 3 reps - 30 sec  hold - Bent Knee Fallouts  - 2 x daily - 7 x weekly - 1-2 sets - 10 reps - 2-3 sec  hold - Hooklying Hamstring Stretch with Strap  - 1 x daily - 7 x weekly - 1 sets - 3 reps - 30 sec  hold - Supine ITB Stretch with Strap  - 1 x daily - 7 x weekly - 1 sets - 3 reps - 30 sec  hold - Hip Flexor Stretch at Edge of Bed  - 2 x daily - 7 x weekly - 1 sets - 3 reps - 30 sec  hold - Supine Sciatic  Nerve Glide  - 2 x daily - 7 x weekly - 1 sets - 8-10 reps - 1-2 sec  hold - Supine Bridge  - 2 x daily - 7 x weekly - 1-2 sets - 10 reps - 5-10 sec  hold - Hooklying Isometric Clamshell  - 2 x daily - 7 x weekly - 1 sets - 10 reps - 3 sec  hold - Plank on Counter  - 1 x daily - 7 x weekly - 1 sets - 3 reps - 30 sec  hold - Prone Press Up  - 2 x daily - 7 x weekly - 1 sets - 10 reps - Prone Gluteal Sets  - 2 x daily - 7 x weekly - 1 sets - 10 reps - 10 sec  hold - Standing Anatomical Position with Scapular Retraction and Depression at Wall  - 2 x daily - 7 x weekly - 1 sets - 5-10 reps - 5-10 sec  hold - Hip Adductors and Hamstring Stretch with Strap  - 1 x daily - 7 x weekly - 1 sets - 3 reps - 30 sec  hold - Supine Bridge with Resistance Band  - 2 x daily - 7 x weekly - 1-2 sets - 10 reps - 5-10 sec  hold - Standing Piriformis Release with Ball  at [employer]  - 2 x daily - 7 x weekly - 30-60 sec  hold - Supine Transversus Abdominis Bracing with Pelvic Floor Contraction  - 2 x daily - 7 x weekly - 1 sets - 10 reps - 10sec  hold - Hip Adductors and Hamstring Stretch with Strap  - 1 x daily - 7 x weekly - 1 sets - 3 reps - 30 sec  hold  ASSESSMENT:  CLINICAL IMPRESSION: Patient reports increase in L lateral hip pain and groin pain over the past few days related to increased activity; family visiting; plumbing problems at home. No time for exercises. Note tightness in hip flexors including iliacus, psoas; quads; adductors as well as posterior hip in gluts and piriformis on Lt . Treatment consisted of manual work including release for iliacus and TRP/deep tissue work through the L hip patient supine and prone. Odetta will continue to worked on gait forward and backward with focus on alignment and slowing down to improve gait pattern and decrease limp on L LE in weight bearing L. Good response to myofacial release and manual work with decreased tightness and pain with treatment. Added standing with posterior hips against counter working on hip and trunk extension. Encouraged consistent exercise.   Eval: Patient is a 78 y.o. female who was seen today for physical therapy evaluation and treatment for chronic L sided LBP with L sided sciatica. She reports significant increase in symptoms in the past 2 weeks with no known injury. Patient has poor posture and alignment; limited trunk and L > R LE mobility/ROM; decreased strength L > R LE; core weakness; abnormal gait pattern; antalgic gait; muscular tightness to palpation with significant tightness and shortening through the hip flexors L > R; abnormal postures and transfers/transitional movement. Patient will benefit from phyaical therapy to address problems identified.   GOALS: Goals reviewed with patient? Yes  SHORT TERM GOALS: Target date: 09/06/2024  Independent in initial HEP  Baseline: Goal status:  INITIAL  2.  Improve tissue extensibility through the lumbar spine and L? R LE's allowing patient to achieve good upright posture in standing  Baseline:  Goal status: INITIAL  3.  Improve  gait with patient to report minimal pain with standing and walking > 15-20 minutes  Baseline:  Goal status: INITIAL  4.  Patient reports 50% decrease in pain with moving sit to stand  Baseline:  Goal status: INITIAL   LONG TERM GOALS: Target date: 10/04/2024  Decrease back and L LE pain by 75-90% allowing patient to return to normal functional activities  Baseline:  Goal status: INITIAL  2.  Increase hip extension ROM to neutral bilat to improve functional activity tolerance  Baseline:  Goal status: INITIAL  3.  Increase strength bilat LE's to 4+/5 to 5/5  Baseline:  Goal status: INITIAL  4.  Patient reports minimal to no pain with transitioning from sit to stand  Baseline:  Goal status: INITIAL  5.  Improve modified Oswestry score by 10%  Baseline: 13/50; 26%  Goal status: INITIAL  6.  Independent in advance HEP  Baseline:  Goal status: INITIAL  PLAN:  PT FREQUENCY: 2x/week  PT DURATION: 8 weeks  PLANNED INTERVENTIONS: [id number]- PT Re-evaluation, 97110-Therapeutic exercises, 97530- Therapeutic activity, 97112- Neuromuscular re-education, 97535- Self Care, [id number]- Manual therapy, [phone]- Gait training, [phone]- Electrical stimulation (unattended), Patient/Family education, Balance training, Stair training, Taping, and Joint mobilization.  PLAN FOR NEXT SESSION: review and progress exercises; continue with back care and ergonomic education/correction; manual work and modalities as indicated    Oskar Cretella P Malyah Ohlrich, PT 09/04/2024, 11:03 AM  "

## 2024-09-06 ENCOUNTER — Ambulatory Visit: Admitting: Rehabilitative and Restorative Service Providers"

## 2024-09-06 ENCOUNTER — Encounter: Payer: Self-pay | Admitting: Rehabilitative and Restorative Service Providers"

## 2024-09-06 DIAGNOSIS — M5432 Sciatica, left side: Secondary | ICD-10-CM

## 2024-09-06 DIAGNOSIS — R29898 Other symptoms and signs involving the musculoskeletal system: Secondary | ICD-10-CM

## 2024-09-06 DIAGNOSIS — M5459 Other low back pain: Secondary | ICD-10-CM

## 2024-09-06 DIAGNOSIS — M6281 Muscle weakness (generalized): Secondary | ICD-10-CM

## 2024-09-06 DIAGNOSIS — R2689 Other abnormalities of gait and mobility: Secondary | ICD-10-CM

## 2024-09-06 NOTE — Therapy (Signed)
 " OUTPATIENT PHYSICAL THERAPY THORACOLUMBAR TREATMENT   Patient Name: Danielle Parsons MRN: [id number] DOB:Feb 26, 1946, 78 y.o., female Today's Date: 09/06/2024  END OF SESSION:  PT End of Session - 09/06/24 1106     Visit Number 9    Number of Visits 16    Date for Recertification  10/04/24    Authorization Type medicare and mutual of [place]    Progress Note Due on Visit 10    PT Start Time 1100    PT Stop Time 1145    PT Time Calculation (min) 45 min    Activity Tolerance Patient tolerated treatment well         Past Medical History:  Diagnosis Date   Anxiety    H/O bladder infections    Past Surgical History:  Procedure Laterality Date   CESAREAN SECTION  1979   Patient Active Problem List   Diagnosis Date Noted   Right elbow pain 09/27/2019   Right shoulder pain 09/27/2019   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   Atrophic vaginitis 01/05/2018   Female stress incontinence 01/05/2018   GAD (generalized anxiety disorder) 02/18/2017   Osteopenia of multiple sites 02/18/2017   Cystocele with rectocele 11/16/2016    PCP: Benton LITTIE Quay, PA  REFERRING PROVIDER: Benton LITTIE Quay, PA  REFERRING DIAG: 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   Rationale for Evaluation and Treatment: Rehabilitation  THERAPY DIAG:  Sciatica, left side  Other low back pain  Other symptoms and signs involving the musculoskeletal system  Muscle weakness (generalized)  Other abnormalities of gait and mobility  ONSET DATE: 07/24/24  SUBJECTIVE:                                                                                                                                                                                           SUBJECTIVE STATEMENT: Patient reports that she has continued L hip pain in the past several days likely related to activities with Christmas. She has continued pain in the the L hip but pain is not as bad. She is limping again but walking improves when she has been  standing/walking for a minute of so. She is sitting less at at home and trying to tighten her core when she is walking. Eval: Patient reports that the L LBP and L LE pain for years but it has gotten worse in the past couple of weeks with no known injury. She has had pain over the past two years when walking but the pain resolved with rest. Sometimes feels weak in the L leg but has not fallen.   PERTINENT HISTORY:  Anxiety; bladder infections; bladder  tack 2018; C-section 1979; osteoporosis   PAIN:  Are you having pain? Yes: NPRS scale: 1/10; best in past week 0/10; worst in past week 6/10  Pain location: across back; L leg -posterior to lateral thigh, calf and anterior shin  Pain description: not as sharp in groin  Aggravating factors: sitting; moving from sit to stand  Relieving factors: walking; resting   PRECAUTIONS: None   WEIGHT BEARING RESTRICTIONS: No  FALLS:  Has patient fallen in last 6 months? No  LIVING ENVIRONMENT: Lives with: lives alone Lives in: House/apartment Stairs: Yes: External: 3  steps; on left going up Has following equipment at home: Single point cane  OCCUPATION: retired from [employer] - retired ~ 10 years ago some sitting at work but some standing Cooking; cleaning; household chores; yard work; walking daily ~ 30 min inclines; recliner    PATIENT GOALS: get rid of pain and walk without limping   NEXT MD VISIT: 12/05/24  OBJECTIVE:  Note: Objective measures were completed at Evaluation unless otherwise noted.  DIAGNOSTIC FINDINGS:  None for back or hip   PATIENT SURVEYS:  Modified Oswestry Low Back Scale - 13/50; 26%   SENSATION: Some tingling in L LE   MUSCLE LENGTH: Hamstrings: Right 65 deg; Left 60 deg Hip flexor tightness: Right -5 to -10 deg; Left -10 -15 deg  POSTURE: rounded shoulders, forward head, decreased lumbar lordosis, increased thoracic kyphosis, flexed trunk , and weight shift right  PALPATION: Significant tightness in L >> R  hip flexors (iliacus and psoas); proximal quads; posterior hip through the piriformis; glut med/min; bilat lumbar paraspinals   LUMBAR ROM: pain with lumbar ROM in all planes   AROM eval  Flexion 70%  Extension 40%  Right lateral flexion 30%  Left lateral flexion 40%  Right rotation 20%  Left rotation 25%   (Blank rows = not tested)  LOWER EXTREMITY ROM:   approximate A-AAROM   Active  Right eval Left eval  Hip flexion 110 105  Hip extension -5 to-10  -10 to -15   Hip abduction    Hip adduction    Hip internal rotation Tight  Tight > than R  Hip external rotation Tight  Tight > than R   Knee flexion WNL's WNL's  Knee extension WNL's  WNL's   Ankle dorsiflexion    Ankle plantarflexion    Ankle inversion    Ankle eversion     (Blank rows = not tested)  LOWER EXTREMITY MMT:  assessed in supine and sidelying   MMT Right eval Left eval  Hip flexion 4/5 Discomfort  Painful AROM  Hip extension    Hip abduction 4 4-  Hip adduction    Hip internal rotation    Hip external rotation    Knee flexion    Knee extension    Ankle dorsiflexion    Ankle plantarflexion    Ankle inversion    Ankle eversion     (Blank rows = not tested)  LUMBAR SPECIAL TESTS:  Straight leg raise test: Negative, Slump test: Negative Hip pain and tightness with L SLR no change in back pain   FUNCTIONAL TESTS:  5 times sit to stand: 18.76 sec    SLS R 3 sec; L 1 sec  GAIT: Distance walked: 40 feet  Assistive device utilized: None Level of assistance: Complete Independence Comments: antalgic gait with significant limp on L LE; L LE flexed at hip and knee; trunk flexed forward; weight shifted to the R  [facility] Adult PT Treatment:                                                DATE: 09/06/2024 Therapeutic Exercise: Supine:  Assisted hip flexor stretch off edge of table in supine 30 sec x 3  Piriformis stretch with opp leg straight 3 x 30 sec Hip adductor stretch with with strap 3 x 30  sec HS & ITB stretches with strap 2 x 30 sec each  Hip adductor stretch - butterfly L knee supported on bolster 1 min  Manual: Supine   Iliacus release L/R 90 sec  STM/TPR L psoas; hip adductors; quads  Passive adductor stretch pt supine with PT assist 30 sec x 3  Prolonged adductor stretch L knee resting on bolster ~ 1 min  Windshield wiper hips and knees flexed alternating 15-20  Myofacial release with L LE working onto [employer] with hip and leg extended  Prone  STM/TPR L gluts/piriformis  Passive knee flexion stretch 20-30 sec x 3 Windshield wiper hip extended; knee flexed 90 deg for IR/ER ROM with contract relax for ER Traction through L LElong axis Neuromuscular re-ed: Prone glut set 10 sec x 10  Standing anatomical position back to wall with glut set  Activation of core with pelvic floor and transverse abdominals supine 10 sec hold  Therapeutic Activity: Prone on elbows Prone press up  on elbows x 10  Bridge with blue TB distal thigh 3 sec x 10  Alternate hip abduction blue TB 3 sec x 10  Standing posture and alignment - posterior hips against counter leaning back to stretch hips and back.  Backward and forward walking with focus on glut activation and hip extension  Ball release prone and supine - standing for home     [facility] Adult PT Treatment:                                                DATE: 09/04/2024 Therapeutic Exercise: Supine:  Assisted hip flexor stretch off edge of table in supine 30 sec x 3  Piriformis stretch with opp leg straight 3 x 30 sec Hip adductor stretch with with strap 3 x 30 sec HS & ITB stretches with strap 2 x 30 sec each  Manual: Supine   Iliacus release L/R 90 sec  STM/TPR L psoas; hip adductors; quads  Passive adductor stretch pt supine with PT assist 30 sec x 3  Myofacial release with L LE working onto IR with hip and leg extended  Prone  STM/TPR L gluts/piriformis  Passive knee flexion stretch 20-30 sec x 3 Windshield wiper hip extended;  knee flexed 90 deg for IR/ER ROM with contract relax for ER Traction through L LElong axis Neuromuscular re-ed: Prone glut set 10 sec x 10  Standing anatomical position back to wall with glut set  Activation of core with pelvic floor and transverse abdominals supine 10 sec hold  Therapeutic Activity: Prone on elbows Prone press up x10  Bridging 5 sec x 10  Bridge with blue TB distal thigh 3 sec x 10  Alternate hip abduction blue TB 3 sec x 10  Standing posture and alignment - posterior hips against counter leaning back to stretch hips and  back.  Backward and forward walking with focus on glut activation and hip extension  Ball release prone and supine - standing for home                                                                                                                      PATIENT EDUCATION:  Education details: Updated HEP  Person educated: Patient Education method: Explanation, Demonstration, Tactile cues, Verbal cues, and Handouts Education comprehension: verbalized understanding, returned demonstration, verbal cues required, tactile cues required, and needs further education  HOME EXERCISE PROGRAM: Access Code: [email or web address] URL: [email or web address] Date: 09/06/2024 Prepared by: Makai Dumond  Exercises - Supine Piriformis Stretch with Leg Straight  - 2 x daily - 7 x weekly - 1 sets - 3 reps - 30 sec  hold - Supine ITB Stretch with Strap  - 1 x daily - 7 x weekly - 1 sets - 3 reps - 30 sec  hold - Hip Flexor Stretch at Edge of Bed  - 2 x daily - 7 x weekly - 1 sets - 3 reps - 30 sec  hold - Standing Piriformis Release with Ball at [employer]  - 2 x daily - 7 x weekly - 30-60 sec  hold - Clamshell  - 1 x daily - 7 x weekly - 3 sets - 10 reps - Sidelying Reverse Clamshell  - 1 x daily - 7 x weekly - 3 sets - 10 reps - Doorway Rhomboid Stretch  - 1 x daily - 7 x weekly - 1 sets - 3 reps - 10-30 sec hold - Standing Quadratus Lumborum Stretch with Doorway  - 1 x  daily - 7 x weekly - 1 sets - 3 reps - 10-30 sec hold - Psoas Mobilization with Small Ball  - 1 x daily - 7 x weekly - 1 sets - 1 reps - Supine Transversus Abdominis Bracing with Pelvic Floor Contraction  - 2 x daily - 7 x weekly - 1 sets - 10 reps - 10sec  hold - Bridge  - 2 x daily - 7 x weekly - 1-2 sets - 10 reps - 5 sec  hold - Hooklying Isometric Clamshell  - 2 x daily - 7 x weekly - 1 sets - 10 reps - 3 sec  hold - Prone Gluteal Sets  - 2 x daily - 7 x weekly - 1 sets - 10 reps - 10 sec  hold - Seated Hip Flexor Stretch  - 2 x daily - 7 x weekly - 1 sets - 3 reps - 30 sec  hold - Hip Adductors and Hamstring Stretch with Strap  - 1 x daily - 7 x weekly - 1 sets - 3 reps - 30 sec  hold - Supine Bridge with Resistance Band  - 2 x daily - 7 x weekly - 1-2 sets - 10 reps - 5-10 sec  hold - Supine Hip Adductor Stretch  - 1 x daily - 7 x weekly - 1 sets -  3 reps - 30 sec  hold - Sidelying Hip Abduction  - 1 x daily - 7 x weekly - 3 sets - 10 reps - 3-5 sec  hold - Supine Hip Adductor Stretch  - 1 x daily - 7 x weekly - 1 sets - 1-2 reps - 1-2 min  hold  ASSESSMENT:  CLINICAL IMPRESSION: Patient reports continued L lateral hip pain and groin pain but less intense pain. She is working on exercises as she can with the Christmas festivities she has planned. Note persistent palpable tightness in hip flexors including iliacus, psoas; quads; adductors as well as posterior hip in gluts and piriformis on Lt. Significant tightness noted in the adductors today. Treatment consisted of manual work including release for iliacus and TRP/deep tissue work through the L hip patient supine and prone. She has difficulty with sidelying hip abduction substituting with hip flexors - will hold this exercise. Danielle Parsons will continue to worked on gait forward and backward  to improve weight bearing and stance time on L LE. Good response to myofacial release and manual work with decreased tightness and pain with treatment. Patient  continues to tighten hip musculature with activities at home preparing and hosting family functions. Encouraged consistent exercise as much as possible.   Eval: Patient is a 78 y.o. female who was seen today for physical therapy evaluation and treatment for chronic L sided LBP with L sided sciatica. She reports significant increase in symptoms in the past 2 weeks with no known injury. Patient has poor posture and alignment; limited trunk and L > R LE mobility/ROM; decreased strength L > R LE; core weakness; abnormal gait pattern; antalgic gait; muscular tightness to palpation with significant tightness and shortening through the hip flexors L > R; abnormal postures and transfers/transitional movement. Patient will benefit from phyaical therapy to address problems identified.   GOALS: Goals reviewed with patient? Yes  SHORT TERM GOALS: Target date: 09/06/2024  Independent in initial HEP  Baseline: Goal status: met   2.  Improve tissue extensibility through the lumbar spine and L>R LE's allowing patient to achieve good upright posture in standing  Baseline:  Goal status: on going   3.  Improve gait with patient to report minimal pain with standing and walking > 15-20 minutes  Baseline:  Goal status: met  4.  Patient reports 50% decrease in pain with moving sit to stand  Baseline:  Goal status: met    LONG TERM GOALS: Target date: 10/04/2024  Decrease back and L LE pain by 75-90% allowing patient to return to normal functional activities  Baseline:  Goal status: INITIAL  2.  Increase hip extension ROM to neutral bilat to improve functional activity tolerance  Baseline:  Goal status: INITIAL  3.  Increase strength bilat LE's to 4+/5 to 5/5  Baseline:  Goal status: INITIAL  4.  Patient reports minimal to no pain with transitioning from sit to stand  Baseline:  Goal status: INITIAL  5.  Improve modified Oswestry score by 10%  Baseline: 13/50; 26%  Goal status: INITIAL  6.   Independent in advance HEP  Baseline:  Goal status: INITIAL  PLAN:  PT FREQUENCY: 2x/week  PT DURATION: 8 weeks  PLANNED INTERVENTIONS: 97164- PT Re-evaluation, 97110-Therapeutic exercises, 97530- Therapeutic activity, 97112- Neuromuscular re-education, 97535- Self Care, [id number]- Manual therapy, [phone]- Gait training, [phone]- Electrical stimulation (unattended), Patient/Family education, Balance training, Stair training, Taping, and Joint mobilization.  PLAN FOR NEXT SESSION: review and progress exercises; continue with back care  and ergonomic education/correction; manual work and modalities as indicated    Kingson Lohmeyer P Shanesha Bednarz, PT 09/06/2024, 11:06 AM  "

## 2024-09-11 ENCOUNTER — Other Ambulatory Visit: Payer: Self-pay

## 2024-09-11 DIAGNOSIS — G8929 Other chronic pain: Secondary | ICD-10-CM

## 2024-09-13 NOTE — Telephone Encounter (Signed)
 Please call patient and see how often she is actually taking it I know it says up to 3 times a day but it already has 2 refills on it and it was just filled 8 weeks ago.  If she is using it that much she really needs to back off and just use it occasionally.  She may be way over using this medication.

## 2024-09-18 ENCOUNTER — Ambulatory Visit: Attending: Urgent Care | Admitting: Rehabilitative and Restorative Service Providers"

## 2024-09-18 ENCOUNTER — Telehealth: Payer: Self-pay | Admitting: Urgent Care

## 2024-09-18 ENCOUNTER — Encounter: Payer: Self-pay | Admitting: Rehabilitative and Restorative Service Providers"

## 2024-09-18 DIAGNOSIS — M6281 Muscle weakness (generalized): Secondary | ICD-10-CM | POA: Insufficient documentation

## 2024-09-18 DIAGNOSIS — M5432 Sciatica, left side: Secondary | ICD-10-CM | POA: Diagnosis present

## 2024-09-18 DIAGNOSIS — R2689 Other abnormalities of gait and mobility: Secondary | ICD-10-CM | POA: Diagnosis present

## 2024-09-18 DIAGNOSIS — M5459 Other low back pain: Secondary | ICD-10-CM | POA: Diagnosis present

## 2024-09-18 DIAGNOSIS — R29898 Other symptoms and signs involving the musculoskeletal system: Secondary | ICD-10-CM | POA: Insufficient documentation

## 2024-09-18 DIAGNOSIS — N390 Urinary tract infection, site not specified: Secondary | ICD-10-CM

## 2024-09-18 MED ORDER — CEPHALEXIN 250 MG PO CAPS: 250.0000 mg | ORAL_CAPSULE | Freq: Every day | ORAL | 1 refills | Status: AC

## 2024-09-18 NOTE — Telephone Encounter (Signed)
 Prescription Request  09/18/2024  LOV: 08/07/2024  What is the name of the medication or equipment? cephALEXin  (KEFLEX ) 250 MG capsule   Have you contacted your pharmacy to request a refill? Yes   Which pharmacy would you like this sent to?  [id number] - [place], [place] - [address]  [place] [address] Phone: [phone] Fax: [phone]    Patient notified that their request is being sent to the clinical staff for review and that they should receive a response within 2 business days.   Please advise at [facility] [phone]

## 2024-09-18 NOTE — Therapy (Signed)
 " OUTPATIENT PHYSICAL THERAPY THORACOLUMBAR TREATMENT 10th Visit Medicare Progress Note Reporting Period 08/09/24 to 09/18/24  See note below for Objective Data and Assessment of Progress/Goals.       Patient Name: Danielle Parsons MRN: [id number] DOB:11-Apr-1946, 79 y.o., female Today's Date: 09/18/2024  END OF SESSION:  PT End of Session - 09/18/24 0851     Visit Number 10    Number of Visits 16    Date for Recertification  10/04/24    Authorization Type medicare and mutual of [place]    Progress Note Due on Visit 10    PT Start Time 0845    PT Stop Time 0930    PT Time Calculation (min) 45 min    Activity Tolerance Patient tolerated treatment well         Past Medical History:  Diagnosis Date   Anxiety    H/O bladder infections    Past Surgical History:  Procedure Laterality Date   CESAREAN SECTION  1979   Patient Active Problem List   Diagnosis Date Noted   Right elbow pain 09/27/2019   Right shoulder pain 09/27/2019   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   Atrophic vaginitis 01/05/2018   Female stress incontinence 01/05/2018   GAD (generalized anxiety disorder) 02/18/2017   Osteopenia of multiple sites 02/18/2017   Cystocele with rectocele 11/16/2016    PCP: Benton LITTIE Quay, PA  REFERRING PROVIDER: Benton LITTIE Quay, PA  REFERRING DIAG: 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   Rationale for Evaluation and Treatment: Rehabilitation  THERAPY DIAG:  Sciatica, left side  Other low back pain  Other symptoms and signs involving the musculoskeletal system  Muscle weakness (generalized)  Other abnormalities of gait and mobility  ONSET DATE: 07/24/24  SUBJECTIVE:                                                                                                                                                                                           SUBJECTIVE STATEMENT: Patient reports that she has continued L hip pain in the past several days likely related to  activities with Christmas. She has continued pain in the the L hip but pain is not as bad. She is limping again but walking improves when she has been standing/walking for a minute of so. She is sitting less at at home and trying to tighten her core when she is walking. Eval: Patient reports that the L LBP and L LE pain for years but it has gotten worse in the past couple of weeks with no known injury. She has had pain over the past two years when walking but  the pain resolved with rest. Sometimes feels weak in the L leg but has not fallen.   PERTINENT HISTORY:  Anxiety; bladder infections; bladder tack 2018; C-section 1979; osteoporosis   PAIN:  Are you having pain? Yes: NPRS scale: 1/10; best in past week 0/10; worst in past week 6/10  Pain location: across back; L leg -posterior to lateral thigh, calf and anterior shin  Pain description: not as sharp in groin  Aggravating factors: sitting; moving from sit to stand  Relieving factors: walking; resting   PRECAUTIONS: None   WEIGHT BEARING RESTRICTIONS: No  FALLS:  Has patient fallen in last 6 months? No  LIVING ENVIRONMENT: Lives with: lives alone Lives in: House/apartment Stairs: Yes: External: 3  steps; on left going up Has following equipment at home: Single point cane  OCCUPATION: retired from [employer] - retired ~ 10 years ago some sitting at work but some standing Cooking; cleaning; household chores; yard work; walking daily ~ 30 min inclines; recliner    PATIENT GOALS: get rid of pain and walk without limping   NEXT MD VISIT: 12/05/24  OBJECTIVE:  Note: Objective measures were completed at Evaluation unless otherwise noted.  DIAGNOSTIC FINDINGS:  None for back or hip   PATIENT SURVEYS:  Modified Oswestry Low Back Scale - 13/50; 26% 09/18/24: 5/50; 10%    SENSATION: Some tingling in L LE   MUSCLE LENGTH: Hamstrings: Right 65 deg; Left 60 deg Hip flexor tightness: Right -5 to -10 deg; Left -10 -15 deg  POSTURE:  rounded shoulders, forward head, decreased lumbar lordosis, increased thoracic kyphosis, flexed trunk , and weight shift right  PALPATION: Significant tightness in L >> R hip flexors (iliacus and psoas); proximal quads; posterior hip through the piriformis; glut med/min; bilat lumbar paraspinals  09/18/24: decreased tightness L and R hip flexors; quads; piriformis; gluts  LUMBAR ROM: pain with lumbar ROM in all planes   AROM eval 09/18/24  Flexion 70%   Extension 40%   Right lateral flexion 30%   Left lateral flexion 40%   Right rotation 20%   Left rotation 25%    (Blank rows = not tested)  LOWER EXTREMITY ROM:   approximate A-AAROM    09/18/24: improving mobility and tissue extensibility bilat LE's   Active  Right eval Right  09/18/24 Left eval Left  09/18/24  Hip flexion 110  105   Hip extension -5 to-10  0 to -5 -10 to -15  0  Hip abduction      Hip adduction      Hip internal rotation Tight   Tight > than R   Hip external rotation Tight   Tight > than R    Knee flexion WNL's  WNL's   Knee extension WNL's   WNL's    Ankle dorsiflexion      Ankle plantarflexion      Ankle inversion      Ankle eversion       (Blank rows = not tested)  LOWER EXTREMITY MMT:  assessed in supine and sidelying   MMT Right eval Right  09/18/24 Left eval Left 09/18/24  Hip flexion 4/5 Discomfort  5/5 Painful AROM 4+/5 discomfort   Hip extension      Hip abduction 4 5 4- 4+  Hip adduction      Hip internal rotation      Hip external rotation      Knee flexion      Knee extension      Ankle dorsiflexion  Ankle plantarflexion      Ankle inversion      Ankle eversion       (Blank rows = not tested)  LUMBAR SPECIAL TESTS:  Straight leg raise test: Negative, Slump test: Negative Hip pain and tightness with L SLR no change in back pain   FUNCTIONAL TESTS:  5 times sit to stand: 18.76 sec    SLS R 3 sec; L 1 sec     09/18/24; 5 time sit to stand: 16.65 sec   SLS: R 3 sec; L 1 sec    GAIT: Distance walked: 40 feet  Assistive device utilized: None Level of assistance: Complete Independence Comments: antalgic gait with significant limp on L LE; L LE flexed at hip and knee; trunk flexed forward; weight shifted to the R     [facility] Adult PT Treatment:                                                DATE: 09/18/24 Therapeutic Exercise: Supine:  Assisted hip flexor stretch off edge of table in supine 30 sec x 3  Piriformis stretch with opp leg straight 3 x 30 sec Hip adductor stretch with with strap 3 x 30 sec HS & ITB stretches with strap 2 x 30 sec each  Quad stretch prone with strap 30 sec x 3 Manual: Supine   Iliacus release L/R 90 sec  STM/TPR L psoas; hip adductors; quads  Passive assisted adductor stretch pt supine with PT assist 30 sec x 3 Myofacial release with L LE working onto IR with hip and leg extended  Prone  STM/TPR L gluts/piriformis  Passive knee flexion stretch 20-30 sec x 3 Windshield wiper hip extended; knee flexed 90 deg for IR/ER ROM with contract relax for ER Traction through L LElong axis Neuromuscular re-ed: Supine glut set 5 sec x 10  Prone glut set 10 sec x 10  Windshield wiper hips and knees flexed alternating 15-20  Standing anatomical position back to wall with glut set  Activation of core with pelvic floor and transverse abdominals supine 10 sec hold  Single leg stance 10 sec x 5 R/L UE support for balance  Therapeutic Activity: Prone on elbows Prone press up  on elbows x 10  Bridge with blue TB distal thigh 3 sec x 10  Alternate hip abduction blue TB 3 sec x 10  Standing posture and alignment - posterior hips against counter leaning back to stretch hips and back.  Backward and forward walking with focus on glut activation and hip extension  Ball release prone and supine - standing for home    [facility] Adult PT Treatment:                                                DATE: 09/06/2024 Therapeutic Exercise: Supine:  Assisted hip  flexor stretch off edge of table in supine 30 sec x 3  Piriformis stretch with opp leg straight 3 x 30 sec Hip adductor stretch with with strap 3 x 30 sec HS & ITB stretches with strap 2 x 30 sec each  Hip adductor stretch - butterfly L knee supported on bolster 1 min  Manual: Supine   Iliacus release L/R 90  sec  STM/TPR L psoas; hip adductors; quads  Passive adductor stretch pt supine with PT assist 30 sec x 3  Prolonged adductor stretch L knee resting on bolster ~ 1 min  Windshield wiper hips and knees flexed alternating 15-20  Myofacial release with L LE working onto IR with hip and leg extended  Prone  STM/TPR L gluts/piriformis  Passive knee flexion stretch 20-30 sec x 3 Windshield wiper hip extended; knee flexed 90 deg for IR/ER ROM with contract relax for ER Traction through L LElong axis Neuromuscular re-ed: Prone glut set 10 sec x 10  Standing anatomical position back to wall with glut set  Activation of core with pelvic floor and transverse abdominals supine 10 sec hold  Therapeutic Activity: Prone on elbows Prone press up  on elbows x 10  Bridge with blue TB distal thigh 3 sec x 10  Alternate hip abduction blue TB 3 sec x 10  Standing posture and alignment - posterior hips against counter leaning back to stretch hips and back.  Backward and forward walking with focus on glut activation and hip extension  Ball release prone and supine - standing for home         PATIENT EDUCATION:  Education details: Updated HEP  Person educated: Patient Education method: Explanation, Demonstration, Tactile cues, Verbal cues, and Handouts Education comprehension: verbalized understanding, returned demonstration, verbal cues required, tactile cues required, and needs further education  HOME EXERCISE PROGRAM: Access Code: [email or web address] URL: [email or web address] Date: 09/18/2024 Prepared by: Amonda Brillhart  Exercises - Supine Piriformis Stretch with Leg Straight  - 2 x daily  - 7 x weekly - 1 sets - 3 reps - 30 sec  hold - Supine ITB Stretch with Strap  - 1 x daily - 7 x weekly - 1 sets - 3 reps - 30 sec  hold - Hip Flexor Stretch at Edge of Bed  - 2 x daily - 7 x weekly - 1 sets - 3 reps - 30 sec  hold - Standing Piriformis Release with Ball at [employer]  - 2 x daily - 7 x weekly - 30-60 sec  hold - Clamshell  - 1 x daily - 7 x weekly - 3 sets - 10 reps - Sidelying Reverse Clamshell  - 1 x daily - 7 x weekly - 3 sets - 10 reps - Doorway Rhomboid Stretch  - 1 x daily - 7 x weekly - 1 sets - 3 reps - 10-30 sec hold - Standing Quadratus Lumborum Stretch with Doorway  - 1 x daily - 7 x weekly - 1 sets - 3 reps - 10-30 sec hold - Psoas Mobilization with Small Ball  - 1 x daily - 7 x weekly - 1 sets - 1 reps - Supine Transversus Abdominis Bracing with Pelvic Floor Contraction  - 2 x daily - 7 x weekly - 1 sets - 10 reps - 10sec  hold - Bridge  - 2 x daily - 7 x weekly - 1-2 sets - 10 reps - 5 sec  hold - Hooklying Isometric Clamshell  - 2 x daily - 7 x weekly - 1 sets - 10 reps - 3 sec  hold - Prone Gluteal Sets  - 2 x daily - 7 x weekly - 1 sets - 10 reps - 10 sec  hold - Seated Hip Flexor Stretch  - 2 x daily - 7 x weekly - 1 sets - 3 reps - 30 sec  hold - Hip Adductors and Hamstring  Stretch with Strap  - 1 x daily - 7 x weekly - 1 sets - 3 reps - 30 sec  hold - Supine Bridge with Resistance Band  - 2 x daily - 7 x weekly - 1-2 sets - 10 reps - 5-10 sec  hold - Supine Hip Adductor Stretch  - 1 x daily - 7 x weekly - 1 sets - 3 reps - 30 sec  hold - Sidelying Hip Abduction  - 1 x daily - 7 x weekly - 3 sets - 10 reps - 3-5 sec  hold - Supine Hip Adductor Stretch  - 1 x daily - 7 x weekly - 1 sets - 1-2 reps - 1-2 min  hold - Prone Quadriceps Stretch with Strap  - 1 x daily - 7 x weekly - 1 sets - 3 reps - 30 sec  hold - Side Stepping with Resistance at Thighs  - 1 x daily - 7 x weekly - Single Leg Stance with Support  - 1 x daily - 7 x weekly - 1 sets - 5 reps - 10-30 sec   hold  ASSESSMENT:  CLINICAL IMPRESSION: Patient reports continued intermittent L lateral hip and groin pain but less frequent and less intense pain. Note decreased tightness in hip flexors including iliacus, psoas; quads; adductors as well as posterior hip in gluts and piriformis on Lt. Odetta will continue to worked on gait forward and backward  to improve weight bearing and stance time on L LE. Good response to myofacial release and manual work with decreased tightness and pain with treatment. Re-evaluation deconsecrates good gains in ROM, strength and mobility. Patient has accomplished STGs and most of LTGs. She will benefit from continued treatment to reach rehab goals and progress to independent HEP   Eval: Patient is a 79 y.o. female who was seen today for physical therapy evaluation and treatment for chronic L sided LBP with L sided sciatica. She reports significant increase in symptoms in the past 2 weeks with no known injury. Patient has poor posture and alignment; limited trunk and L > R LE mobility/ROM; decreased strength L > R LE; core weakness; abnormal gait pattern; antalgic gait; muscular tightness to palpation with significant tightness and shortening through the hip flexors L > R; abnormal postures and transfers/transitional movement. Patient will benefit from phyaical therapy to address problems identified.   GOALS: Goals reviewed with patient? Yes  SHORT TERM GOALS: Target date: 09/06/2024  Independent in initial HEP  Baseline: Goal status: met   2.  Improve tissue extensibility through the lumbar spine and L>R LE's allowing patient to achieve good upright posture in standing  Baseline:  Goal status: met  3.  Improve gait with patient to report minimal pain with standing and walking > 15-20 minutes  Baseline:  Goal status: met  4.  Patient reports 50% decrease in pain with moving sit to stand  Baseline:  Goal status: met    LONG TERM GOALS: Target date:  10/04/2024  Decrease back and L LE pain by 75-90% allowing patient to return to normal functional activities  09/18/24 70-80% improvement  Goal status: met  2.  Increase hip extension ROM to neutral bilat to improve functional activity tolerance  Baseline:  Goal status: on going   3.  Increase strength bilat LE's to 4+/5 to 5/5  Baseline:  Goal status: on going   4.  Patient reports minimal to no pain with transitioning from sit to stand  Baseline:  Goal status:  on going   5.  Improve modified Oswestry score by 10%  Baseline: 13/50; 26%  09/18/24: 5/10; 10%  Goal status: met   6.  Independent in advance HEP  Baseline:  Goal status: on going   PLAN:  PT FREQUENCY: 2x/week  PT DURATION: 8 weeks  PLANNED INTERVENTIONS: 97164- PT Re-evaluation, 97110-Therapeutic exercises, 97530- Therapeutic activity, 97112- Neuromuscular re-education, 97535- Self Care, [id number]- Manual therapy, [phone]- Gait training, [phone]- Electrical stimulation (unattended), Patient/Family education, Balance training, Stair training, Taping, and Joint mobilization.  PLAN FOR NEXT SESSION: review and progress exercises; continue with back care and ergonomic education/correction; manual work and modalities as indicated    Tonjia Parillo P Lian Pounds, PT 09/18/2024, 8:52 AM  "

## 2024-09-21 ENCOUNTER — Other Ambulatory Visit: Payer: Self-pay

## 2024-09-21 DIAGNOSIS — G8929 Other chronic pain: Secondary | ICD-10-CM

## 2024-09-21 NOTE — Telephone Encounter (Signed)
 Last Rx: 08/07/24 #30 with 2 refills

## 2024-09-22 ENCOUNTER — Ambulatory Visit: Admitting: Rehabilitative and Restorative Service Providers"

## 2024-09-22 ENCOUNTER — Ambulatory Visit: Admitting: Urgent Care

## 2024-09-22 ENCOUNTER — Ambulatory Visit

## 2024-09-22 VITALS — BP 128/82 | HR 76 | Ht 60.0 in | Wt 147.0 lb

## 2024-09-22 DIAGNOSIS — M791 Myalgia, unspecified site: Secondary | ICD-10-CM

## 2024-09-22 DIAGNOSIS — M5442 Lumbago with sciatica, left side: Secondary | ICD-10-CM

## 2024-09-22 DIAGNOSIS — G8929 Other chronic pain: Secondary | ICD-10-CM

## 2024-09-22 NOTE — Patient Instructions (Signed)
" °  Please look for a foam roller and do the following stretches.  Please go to [address] for your xrays.  Continue PT. Take the tizanidine  scheduled.    "

## 2024-09-22 NOTE — Progress Notes (Unsigned)
" ° °  Established Patient Office Visit  Subjective:  Patient ID: Danielle Parsons, female    DOB: September 18, 1945  Age: 79 y.o. MRN: [id number]  Chief Complaint  Patient presents with   Leg Pain    Left; front of leg/hip    HPI  {History (Optional):23778}  ROS: as noted in HPI  Objective:     BP 128/82   Pulse 76   Ht 5' (1.524 m)   Wt 147 lb (66.7 kg)   SpO2 96%   BMI 28.71 kg/m  BP Readings from Last 3 Encounters:  09/22/24 128/82  08/07/24 115/72  03/13/24 127/76   Wt Readings from Last 3 Encounters:  09/22/24 147 lb (66.7 kg)  08/07/24 145 lb (65.8 kg)  03/13/24 149 lb 8 oz (67.8 kg)      Physical Exam   No results found for any visits on 09/22/24.  {Labs (Optional):23779}  The 10-year ASCVD risk score (Arnett DK, et al., 2019) is: 17.8%  Assessment & Plan:  There are no diagnoses linked to this encounter.   No follow-ups on file.   Benton LITTIE Gave, PA "

## 2024-09-24 ENCOUNTER — Encounter: Payer: Self-pay | Admitting: Urgent Care

## 2024-09-25 ENCOUNTER — Encounter: Payer: Self-pay | Admitting: Rehabilitative and Restorative Service Providers"

## 2024-09-25 ENCOUNTER — Ambulatory Visit: Admitting: Rehabilitative and Restorative Service Providers"

## 2024-09-25 ENCOUNTER — Other Ambulatory Visit: Payer: Self-pay

## 2024-09-25 DIAGNOSIS — M5432 Sciatica, left side: Secondary | ICD-10-CM

## 2024-09-25 DIAGNOSIS — R2689 Other abnormalities of gait and mobility: Secondary | ICD-10-CM

## 2024-09-25 DIAGNOSIS — M6281 Muscle weakness (generalized): Secondary | ICD-10-CM

## 2024-09-25 DIAGNOSIS — G8929 Other chronic pain: Secondary | ICD-10-CM

## 2024-09-25 DIAGNOSIS — M5459 Other low back pain: Secondary | ICD-10-CM

## 2024-09-25 DIAGNOSIS — R29898 Other symptoms and signs involving the musculoskeletal system: Secondary | ICD-10-CM

## 2024-09-25 MED ORDER — TIZANIDINE HCL 4 MG PO TABS: 4.0000 mg | ORAL_TABLET | Freq: Three times a day (TID) | ORAL | 2 refills | Status: AC

## 2024-09-25 NOTE — Therapy (Signed)
 " OUTPATIENT PHYSICAL THERAPY THORACOLUMBAR TREATMENT    Patient Name: Danielle Parsons MRN: [id number] DOB:October 26, 1945, 79 y.o., female Today's Date: 09/25/2024  END OF SESSION:  PT End of Session - 09/25/24 1103     Visit Number 11    Number of Visits 16    Date for Recertification  10/04/24    Authorization Type medicare and mutual of [place]    Progress Note Due on Visit 11    PT Start Time 1100    PT Stop Time 1145    PT Time Calculation (min) 45 min         Past Medical History:  Diagnosis Date   Anxiety    H/O bladder infections    Past Surgical History:  Procedure Laterality Date   CESAREAN SECTION  1979   Patient Active Problem List   Diagnosis Date Noted   Right elbow pain 09/27/2019   Right shoulder pain 09/27/2019   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   Atrophic vaginitis 01/05/2018   Female stress incontinence 01/05/2018   GAD (generalized anxiety disorder) 02/18/2017   Osteopenia of multiple sites 02/18/2017   Cystocele with rectocele 11/16/2016    PCP: Benton LITTIE Quay, PA  REFERRING PROVIDER: Benton LITTIE Quay, PA  REFERRING DIAG: 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   Rationale for Evaluation and Treatment: Rehabilitation  THERAPY DIAG:  Sciatica, left side  Other low back pain  Other symptoms and signs involving the musculoskeletal system  Muscle weakness (generalized)  Other abnormalities of gait and mobility  ONSET DATE: 07/24/24  SUBJECTIVE:                                                                                                                                                                                           SUBJECTIVE STATEMENT: Patient reports that she has no posterior hip pain but does have some continued L leg pain (quad) in the past several days. She notices this pain after she has been sitting for 30-45 min and stands to walk again. Resolves with walking a few steps. She is no longer having pain in the morning when she  gets out of bed. Sometimes feels weak in the L leg when she has been sitting and stands up; but has not fallen. She has felt this sensation about 3 times in the past week.   PERTINENT HISTORY:  Anxiety; bladder infections; bladder tack 2018; C-section 1979; osteoporosis   PAIN:  Are you having pain? Yes: NPRS scale: 0/10; best in past week 0/10; worst in past week 4/10  Pain location: across back; L leg -posterior to lateral thigh, calf and anterior  shin  Pain description: not as sharp in groin  Aggravating factors: sitting; moving from sit to stand  Relieving factors: walking; resting   PRECAUTIONS: None   WEIGHT BEARING RESTRICTIONS: No  FALLS:  Has patient fallen in last 6 months? No  LIVING ENVIRONMENT: Lives with: lives alone Lives in: House/apartment Stairs: Yes: External: 3  steps; on left going up Has following equipment at home: Single point cane  OCCUPATION: retired from [employer] - retired ~ 10 years ago some sitting at work but some standing Cooking; cleaning; household chores; yard work; walking daily ~ 30 min inclines; recliner    PATIENT GOALS: get rid of pain and walk without limping   NEXT MD VISIT: 12/05/24  OBJECTIVE:  Note: Objective measures were completed at Evaluation unless otherwise noted.  DIAGNOSTIC FINDINGS:  None for back or hip   PATIENT SURVEYS:  Modified Oswestry Low Back Scale - 13/50; 26% 09/18/24: 5/50; 10%    SENSATION: Some tingling in L LE   MUSCLE LENGTH: Hamstrings: Right 65 deg; Left 60 deg Hip flexor tightness: Right -5 to -10 deg; Left -10 -15 deg  POSTURE: rounded shoulders, forward head, decreased lumbar lordosis, increased thoracic kyphosis, flexed trunk , and weight shift right  PALPATION: Significant tightness in L >> R hip flexors (iliacus and psoas); proximal quads; posterior hip through the piriformis; glut med/min; bilat lumbar paraspinals  09/18/24: decreased tightness L and R hip flexors; quads; piriformis;  gluts  LUMBAR ROM: pain with lumbar ROM in all planes 09/18/24  AROM eval 09/18/24  Flexion 70%   Extension 40%   Right lateral flexion 30%   Left lateral flexion 40%   Right rotation 20%   Left rotation 25%    (Blank rows = not tested)  LOWER EXTREMITY ROM:   approximate A-AAROM    09/18/24: improving mobility and tissue extensibility bilat LE's   Active  Right eval Right  09/18/24 Left eval Left  09/18/24  Hip flexion 110  105   Hip extension -5 to-10  0 to -5 -10 to -15  0  Hip abduction      Hip adduction      Hip internal rotation Tight   Tight > than R   Hip external rotation Tight   Tight > than R    Knee flexion WNL's  WNL's   Knee extension WNL's   WNL's    Ankle dorsiflexion      Ankle plantarflexion      Ankle inversion      Ankle eversion       (Blank rows = not tested)  LOWER EXTREMITY MMT:  assessed in supine and sidelying   MMT Right eval Right  09/18/24 Left eval Left 09/18/24 Left  09/25/24  Hip flexion 4/5 Discomfort  5/5 Painful AROM 4+/5 discomfort  5 No discomfort   Hip extension       Hip abduction 4 5 4- 4+ 5  Hip adduction       Hip internal rotation       Hip external rotation       Knee flexion       Knee extension       Ankle dorsiflexion       Ankle plantarflexion       Ankle inversion       Ankle eversion        (Blank rows = not tested)  LUMBAR SPECIAL TESTS:  Straight leg raise test: Negative, Slump test: Negative Hip pain and tightness  with L SLR no change in back pain   FUNCTIONAL TESTS:  5 times sit to stand: 18.76 sec    SLS R 3 sec; L 1 sec     09/18/24; 5 time sit to stand: 16.65 sec   SLS: R 3 sec; L 1 sec   GAIT: Distance walked: 40 feet  Assistive device utilized: None Level of assistance: Complete Independence Comments: antalgic gait with significant limp on L LE; L LE flexed at hip and knee; trunk flexed forward; weight shifted to the R    [facility] Adult PT Treatment:                                                DATE:  09/25/24 Therapeutic Exercise: Prone  Quad stretch with strap 30 sec x 3  Supine:  Assisted hip flexor stretch off edge of table in supine 30 sec x 3  Piriformis stretch with opp leg straight 3 x 30 sec Hip adductor stretch with with strap 3 x 30 sec HS & ITB stretches with strap 2 x 30 sec each  Quad stretch prone with strap 30 sec x 3 Neuromuscular re-ed: Supine glut set 5 sec x 10  Prone glut set 10 sec x 10  Windshield wiper hips and knees flexed alternating 15-20  Standing anatomical position back to wall with glut set  Activation of core with pelvic floor and transverse abdominals supine 10 sec hold  Single leg stance 10 sec x 5 R/L UE support for balance  Hip abduction leading with heel 3 sec x 10  Therapeutic Activity: Prone on elbows Prone press up  on elbows x 10  Bridge with blue TB distal thigh 3 sec x 10  Sit to stand x 5  Standing posture and alignment - posterior hips against counter leaning back to stretch hips and back. (HEP)  Backward and forward walking with focus on glut activation and hip extension (HEP)  Ball release prone and supine - standing for home (HEP)   [facility] Adult PT Treatment:                                                DATE: 09/18/24 Therapeutic Exercise: Supine:  Assisted hip flexor stretch off edge of table in supine 30 sec x 3  Piriformis stretch with opp leg straight 3 x 30 sec Hip adductor stretch with with strap 3 x 30 sec HS & ITB stretches with strap 2 x 30 sec each  Quad stretch prone with strap 30 sec x 3 Manual: Supine   Iliacus release L/R 90 sec  STM/TPR L psoas; hip adductors; quads  Passive assisted adductor stretch pt supine with PT assist 30 sec x 3 Myofacial release with L LE working onto IR with hip and leg extended  Prone  STM/TPR L gluts/piriformis  Passive knee flexion stretch 20-30 sec x 3 Windshield wiper hip extended; knee flexed 90 deg for IR/ER ROM with contract relax for ER Traction through L LElong  axis Neuromuscular re-ed: Supine glut set 5 sec x 10  Prone glut set 10 sec x 10  Windshield wiper hips and knees flexed alternating 15-20  Standing anatomical position back to wall with glut set  Activation  of core with pelvic floor and transverse abdominals supine 10 sec hold  Single leg stance 10 sec x 5 R/L UE support for balance  Therapeutic Activity: Prone on elbows Prone press up  on elbows x 10  Bridge with blue TB distal thigh 3 sec x 10  Alternate hip abduction blue TB 3 sec x 10  Standing posture and alignment - posterior hips against counter leaning back to stretch hips and back.  Backward and forward walking with focus on glut activation and hip extension  Ball release prone and supine - standing for home        PATIENT EDUCATION:  Education details: Updated HEP  Person educated: Patient Education method: Explanation, Demonstration, Tactile cues, Verbal cues, and Handouts Education comprehension: verbalized understanding, returned demonstration, verbal cues required, tactile cues required, and needs further education  HOME EXERCISE PROGRAM: Access Code: [email or web address] URL: [email or web address] Date: 09/25/2024 Prepared by: Carlson Belland  Exercises - Supine Piriformis Stretch with Leg Straight  - 2 x daily - 7 x weekly - 1 sets - 3 reps - 30 sec  hold - Supine ITB Stretch with Strap  - 1 x daily - 7 x weekly - 1 sets - 3 reps - 30 sec  hold - Hip Flexor Stretch at Edge of Bed  - 2 x daily - 7 x weekly - 1 sets - 3 reps - 30 sec  hold - Standing Piriformis Release with Ball at [employer]  - 2 x daily - 7 x weekly - 30-60 sec  hold - Clamshell  - 1 x daily - 7 x weekly - 3 sets - 10 reps - Sidelying Reverse Clamshell  - 1 x daily - 7 x weekly - 3 sets - 10 reps - Psoas Mobilization with Small Ball  - 1 x daily - 7 x weekly - 1 sets - 1 reps - Supine Transversus Abdominis Bracing with Pelvic Floor Contraction  - 2 x daily - 7 x weekly - 1 sets - 10 reps - 10sec  hold -  Bridge  - 2 x daily - 7 x weekly - 1-2 sets - 10 reps - 5 sec  hold - Hooklying Isometric Clamshell  - 2 x daily - 7 x weekly - 1 sets - 10 reps - 3 sec  hold - Prone Gluteal Sets  - 2 x daily - 7 x weekly - 1 sets - 10 reps - 10 sec  hold - Seated Hip Flexor Stretch  - 2 x daily - 7 x weekly - 1 sets - 3 reps - 30 sec  hold - Hip Adductors and Hamstring Stretch with Strap  - 1 x daily - 7 x weekly - 1 sets - 3 reps - 30 sec  hold - Supine Bridge with Resistance Band  - 2 x daily - 7 x weekly - 1-2 sets - 10 reps - 5-10 sec  hold - Supine Hip Adductor Stretch  - 1 x daily - 7 x weekly - 1 sets - 3 reps - 30 sec  hold - Supine Hip Adductor Stretch  - 1 x daily - 7 x weekly - 1 sets - 1-2 reps - 1-2 min  hold - Prone Quadriceps Stretch with Strap  - 1 x daily - 7 x weekly - 1 sets - 3 reps - 30 sec  hold - Side Stepping with Resistance at Thighs  - 1 x daily - 7 x weekly - Single Leg Stance with Support  - 1  x daily - 7 x weekly - 1 sets - 5 reps - 10-30 sec  hold - Standing Single-Leg Romanian Deadlift With Hip Airplane and Chair Support  - 1 x daily - 7 x weekly - 1 sets - 3 reps - 30 sec  hold - Anti-Rotation Lateral Stepping with Press  - 2 x daily - 7 x weekly - 1-2 sets - 10 reps - 2-3 sec  hold - Prone Quadriceps Stretch with Strap  - 1 x daily - 7 x weekly - 1 sets - 3 reps - 30 sec  hold - Sidelying Hip Abduction  - 1 x daily - 7 x weekly - 3 sets - 10 reps - 3-5 sec  hold - Sit to Stand  - 2 x daily - 7 x weekly - 1 sets - 10 reps - 3-5 sec  hold  ASSESSMENT:  CLINICAL IMPRESSION: Patient reports no pain in the  L lateral hip and groin area. She does have some pain in the L quad upon standing after sitting for 30-45 min. Added specific stretch for quads and continued with stretching and strengthening. Patient feels confident in continuing with independent with her HEP. She demonstrates good improvement in pain and gains in strength and ROM. Note decreased tightness in hip flexors  including iliacus, psoas; quads; adductors as well as posterior hip in gluts and piriformis on Lt. Odetta will continue to worked on gait forward and backward to improve weight bearing and stance time on L LE. Good response to myofacial release and manual work with decreased tightness and pain with treatment. Re-evaluation deconsecrates good gains in ROM, strength and mobility. Patient has accomplished STGs and most of LTGs. She will call to schedule additioinal PT appointments if she feels she needs further intervention.   Eval: Patient is a 79 y.o. female who was seen today for physical therapy evaluation and treatment for chronic L sided LBP with L sided sciatica. She reports significant increase in symptoms in the past 2 weeks with no known injury. Patient has poor posture and alignment; limited trunk and L > R LE mobility/ROM; decreased strength L > R LE; core weakness; abnormal gait pattern; antalgic gait; muscular tightness to palpation with significant tightness and shortening through the hip flexors L > R; abnormal postures and transfers/transitional movement. Patient will benefit from phyaical therapy to address problems identified.   GOALS: Goals reviewed with patient? Yes  SHORT TERM GOALS: Target date: 09/06/2024  Independent in initial HEP  Baseline: Goal status: met   2.  Improve tissue extensibility through the lumbar spine and L>R LE's allowing patient to achieve good upright posture in standing  Baseline:  Goal status: met  3.  Improve gait with patient to report minimal pain with standing and walking > 15-20 minutes  Baseline:  Goal status: met  4.  Patient reports 50% decrease in pain with moving sit to stand  Baseline:  Goal status: met    LONG TERM GOALS: Target date: 10/04/2024  Decrease back and L LE pain by 75-90% allowing patient to return to normal functional activities  09/18/24 70-80% improvement  Goal status: met  2.  Increase hip extension ROM to neutral  bilat to improve functional activity tolerance  Baseline:  Goal status: met   3.  Increase strength bilat LE's to 4+/5 to 5/5  Baseline:  Goal status: met   4.  Patient reports minimal to no pain with transitioning from sit to stand  Baseline:  Goal status: partially  met   5.  Improve modified Oswestry score by 10%  Baseline: 13/50; 26%  09/18/24: 5/10; 10%  Goal status: met   6.  Independent in advance HEP  Baseline:  Goal status: met  PLAN:  PT FREQUENCY: 2x/week  PT DURATION: 8 weeks  PLANNED INTERVENTIONS: 97164- PT Re-evaluation, 97110-Therapeutic exercises, 97530- Therapeutic activity, 97112- Neuromuscular re-education, 97535- Self Care, [id number]- Manual therapy, [phone]- Gait training, [phone]- Electrical stimulation (unattended), Patient/Family education, Balance training, Stair training, Taping, and Joint mobilization.  PLAN FOR NEXT SESSION: will continue with independent HEP and call to schedule as needed     Dareon Nunziato SHAUNNA Baptist, PT 09/25/2024, 11:04 AM  "

## 2024-10-01 ENCOUNTER — Ambulatory Visit: Payer: Self-pay | Admitting: Urgent Care

## 2024-12-05 ENCOUNTER — Ambulatory Visit: Admitting: Urgent Care
